# Patient Record
Sex: Female | Born: 1945 | Race: White | Hispanic: No | Marital: Married | Smoking: Current every day smoker
Health system: Southern US, Community
[De-identification: ages and names within clinical notes are randomized; demographics above are authoritative.]

## PROBLEM LIST (undated history)

## (undated) DIAGNOSIS — I6523 Occlusion and stenosis of bilateral carotid arteries: Secondary | ICD-10-CM

## (undated) DIAGNOSIS — Z7901 Long term (current) use of anticoagulants: Secondary | ICD-10-CM

## (undated) DIAGNOSIS — E079 Disorder of thyroid, unspecified: Secondary | ICD-10-CM

## (undated) DIAGNOSIS — I739 Peripheral vascular disease, unspecified: Secondary | ICD-10-CM

## (undated) DIAGNOSIS — F329 Major depressive disorder, single episode, unspecified: Secondary | ICD-10-CM

## (undated) DIAGNOSIS — I7090 Unspecified atherosclerosis: Secondary | ICD-10-CM

## (undated) DIAGNOSIS — K529 Noninfective gastroenteritis and colitis, unspecified: Secondary | ICD-10-CM

## (undated) DIAGNOSIS — M810 Age-related osteoporosis without current pathological fracture: Secondary | ICD-10-CM

## (undated) DIAGNOSIS — Z5181 Encounter for therapeutic drug level monitoring: Secondary | ICD-10-CM

## (undated) DIAGNOSIS — K432 Incisional hernia without obstruction or gangrene: Secondary | ICD-10-CM

## (undated) DIAGNOSIS — R791 Abnormal coagulation profile: Secondary | ICD-10-CM

## (undated) DIAGNOSIS — K922 Gastrointestinal hemorrhage, unspecified: Secondary | ICD-10-CM

## (undated) DIAGNOSIS — Z87442 Personal history of urinary calculi: Secondary | ICD-10-CM

## (undated) DIAGNOSIS — J309 Allergic rhinitis, unspecified: Secondary | ICD-10-CM

## (undated) DIAGNOSIS — J45909 Unspecified asthma, uncomplicated: Secondary | ICD-10-CM

## (undated) DIAGNOSIS — C4490 Unspecified malignant neoplasm of skin, unspecified: Secondary | ICD-10-CM

## (undated) DIAGNOSIS — Z9889 Other specified postprocedural states: Secondary | ICD-10-CM

## (undated) DIAGNOSIS — E559 Vitamin D deficiency, unspecified: Secondary | ICD-10-CM

## (undated) DIAGNOSIS — K589 Irritable bowel syndrome without diarrhea: Secondary | ICD-10-CM

## (undated) DIAGNOSIS — E785 Hyperlipidemia, unspecified: Secondary | ICD-10-CM

## (undated) DIAGNOSIS — R413 Other amnesia: Secondary | ICD-10-CM

## (undated) DIAGNOSIS — E039 Hypothyroidism, unspecified: Secondary | ICD-10-CM

## (undated) DIAGNOSIS — E663 Overweight: Secondary | ICD-10-CM

## (undated) HISTORY — PX: COLONOSCOPY: SHX174

## (undated) HISTORY — DX: Peripheral vascular disease, unspecified: I73.9

## (undated) HISTORY — DX: Encounter for therapeutic drug level monitoring: Z51.81

## (undated) HISTORY — PX: CAROTID STENT: SHX1301

## (undated) HISTORY — DX: Hyperlipidemia, unspecified: E78.5

## (undated) HISTORY — DX: Vitamin D deficiency, unspecified: E55.9

## (undated) HISTORY — DX: Unspecified asthma, uncomplicated: J45.909

## (undated) HISTORY — DX: Gastrointestinal hemorrhage, unspecified: K92.2

## (undated) HISTORY — DX: Occlusion and stenosis of bilateral carotid arteries: I65.23

## (undated) HISTORY — DX: Other amnesia: R41.3

## (undated) HISTORY — DX: Major depressive disorder, single episode, unspecified: F32.9

## (undated) HISTORY — DX: Disorder of thyroid, unspecified: E07.9

## (undated) HISTORY — PX: OTHER SURGICAL HISTORY: SHX169

## (undated) HISTORY — DX: Other specified postprocedural states: Z98.890

## (undated) HISTORY — DX: Allergic rhinitis, unspecified: J30.9

## (undated) HISTORY — DX: Unspecified atherosclerosis: I70.90

## (undated) HISTORY — DX: Overweight: E66.3

## (undated) HISTORY — DX: Incisional hernia without obstruction or gangrene: K43.2

## (undated) HISTORY — DX: Noninfective gastroenteritis and colitis, unspecified: K52.9

## (undated) HISTORY — DX: Long term (current) use of anticoagulants: Z79.01

## (undated) HISTORY — DX: Irritable bowel syndrome, unspecified: K58.9

## (undated) HISTORY — DX: Age-related osteoporosis without current pathological fracture: M81.0

## (undated) HISTORY — DX: Abnormal coagulation profile: R79.1

## (undated) HISTORY — DX: Hypothyroidism, unspecified: E03.9

---

## 1999-11-01 ENCOUNTER — Other Ambulatory Visit: Admission: RE | Admit: 1999-11-01 | Discharge: 1999-11-01 | Payer: Self-pay | Admitting: Family Medicine

## 2004-09-06 ENCOUNTER — Ambulatory Visit: Payer: Self-pay | Admitting: Family Medicine

## 2004-10-18 ENCOUNTER — Ambulatory Visit: Payer: Self-pay | Admitting: Family Medicine

## 2005-04-04 ENCOUNTER — Ambulatory Visit: Payer: Self-pay | Admitting: Family Medicine

## 2005-04-17 ENCOUNTER — Ambulatory Visit: Payer: Self-pay | Admitting: Family Medicine

## 2006-11-20 ENCOUNTER — Telehealth (INDEPENDENT_AMBULATORY_CARE_PROVIDER_SITE_OTHER): Payer: Self-pay | Admitting: *Deleted

## 2008-09-12 ENCOUNTER — Telehealth: Payer: Self-pay | Admitting: Family Medicine

## 2013-05-22 HISTORY — PX: CORONARY ANGIOPLASTY WITH STENT PLACEMENT: SHX49

## 2015-03-16 DIAGNOSIS — I2510 Atherosclerotic heart disease of native coronary artery without angina pectoris: Secondary | ICD-10-CM

## 2015-03-16 DIAGNOSIS — K922 Gastrointestinal hemorrhage, unspecified: Secondary | ICD-10-CM | POA: Insufficient documentation

## 2015-03-16 HISTORY — DX: Atherosclerotic heart disease of native coronary artery without angina pectoris: I25.10

## 2015-03-16 HISTORY — DX: Gastrointestinal hemorrhage, unspecified: K92.2

## 2015-05-06 DIAGNOSIS — Z7901 Long term (current) use of anticoagulants: Secondary | ICD-10-CM | POA: Insufficient documentation

## 2015-05-06 DIAGNOSIS — I739 Peripheral vascular disease, unspecified: Secondary | ICD-10-CM | POA: Insufficient documentation

## 2015-05-06 DIAGNOSIS — Z5181 Encounter for therapeutic drug level monitoring: Secondary | ICD-10-CM

## 2015-05-06 HISTORY — DX: Encounter for therapeutic drug level monitoring: Z51.81

## 2015-05-06 HISTORY — DX: Peripheral vascular disease, unspecified: I73.9

## 2015-05-06 HISTORY — DX: Long term (current) use of anticoagulants: Z79.01

## 2015-05-27 DIAGNOSIS — Z7901 Long term (current) use of anticoagulants: Secondary | ICD-10-CM | POA: Diagnosis not present

## 2015-05-27 DIAGNOSIS — I739 Peripheral vascular disease, unspecified: Secondary | ICD-10-CM | POA: Diagnosis not present

## 2015-05-27 DIAGNOSIS — Z5181 Encounter for therapeutic drug level monitoring: Secondary | ICD-10-CM | POA: Diagnosis not present

## 2015-06-15 DIAGNOSIS — Z7901 Long term (current) use of anticoagulants: Secondary | ICD-10-CM | POA: Diagnosis not present

## 2015-06-15 DIAGNOSIS — Z5181 Encounter for therapeutic drug level monitoring: Secondary | ICD-10-CM | POA: Diagnosis not present

## 2015-06-15 DIAGNOSIS — I739 Peripheral vascular disease, unspecified: Secondary | ICD-10-CM | POA: Diagnosis not present

## 2015-07-06 DIAGNOSIS — Z7901 Long term (current) use of anticoagulants: Secondary | ICD-10-CM | POA: Diagnosis not present

## 2015-07-06 DIAGNOSIS — Z5181 Encounter for therapeutic drug level monitoring: Secondary | ICD-10-CM | POA: Diagnosis not present

## 2015-07-06 DIAGNOSIS — I739 Peripheral vascular disease, unspecified: Secondary | ICD-10-CM | POA: Diagnosis not present

## 2015-07-08 DIAGNOSIS — E039 Hypothyroidism, unspecified: Secondary | ICD-10-CM | POA: Diagnosis not present

## 2015-07-08 DIAGNOSIS — I739 Peripheral vascular disease, unspecified: Secondary | ICD-10-CM | POA: Diagnosis not present

## 2015-07-08 DIAGNOSIS — I10 Essential (primary) hypertension: Secondary | ICD-10-CM | POA: Diagnosis not present

## 2015-07-08 DIAGNOSIS — E663 Overweight: Secondary | ICD-10-CM | POA: Diagnosis not present

## 2015-07-08 DIAGNOSIS — R413 Other amnesia: Secondary | ICD-10-CM | POA: Diagnosis not present

## 2015-07-08 DIAGNOSIS — Z79899 Other long term (current) drug therapy: Secondary | ICD-10-CM | POA: Diagnosis not present

## 2015-07-08 DIAGNOSIS — F329 Major depressive disorder, single episode, unspecified: Secondary | ICD-10-CM | POA: Diagnosis not present

## 2015-07-08 DIAGNOSIS — E559 Vitamin D deficiency, unspecified: Secondary | ICD-10-CM | POA: Diagnosis not present

## 2015-07-08 DIAGNOSIS — Z9889 Other specified postprocedural states: Secondary | ICD-10-CM | POA: Diagnosis not present

## 2015-07-08 DIAGNOSIS — E785 Hyperlipidemia, unspecified: Secondary | ICD-10-CM | POA: Diagnosis not present

## 2015-07-08 DIAGNOSIS — Z6827 Body mass index (BMI) 27.0-27.9, adult: Secondary | ICD-10-CM | POA: Diagnosis not present

## 2015-07-08 DIAGNOSIS — I2510 Atherosclerotic heart disease of native coronary artery without angina pectoris: Secondary | ICD-10-CM | POA: Diagnosis not present

## 2015-07-12 DIAGNOSIS — Z7901 Long term (current) use of anticoagulants: Secondary | ICD-10-CM | POA: Diagnosis not present

## 2015-07-12 DIAGNOSIS — I739 Peripheral vascular disease, unspecified: Secondary | ICD-10-CM | POA: Diagnosis not present

## 2015-07-12 DIAGNOSIS — S01511A Laceration without foreign body of lip, initial encounter: Secondary | ICD-10-CM | POA: Diagnosis not present

## 2015-08-03 DIAGNOSIS — Z7901 Long term (current) use of anticoagulants: Secondary | ICD-10-CM | POA: Diagnosis not present

## 2015-08-03 DIAGNOSIS — I739 Peripheral vascular disease, unspecified: Secondary | ICD-10-CM | POA: Diagnosis not present

## 2015-08-03 DIAGNOSIS — Z5181 Encounter for therapeutic drug level monitoring: Secondary | ICD-10-CM | POA: Diagnosis not present

## 2015-09-07 DIAGNOSIS — Z5181 Encounter for therapeutic drug level monitoring: Secondary | ICD-10-CM | POA: Diagnosis not present

## 2015-09-07 DIAGNOSIS — I739 Peripheral vascular disease, unspecified: Secondary | ICD-10-CM | POA: Diagnosis not present

## 2015-09-07 DIAGNOSIS — Z7901 Long term (current) use of anticoagulants: Secondary | ICD-10-CM | POA: Diagnosis not present

## 2015-09-27 DIAGNOSIS — Z95820 Peripheral vascular angioplasty status with implants and grafts: Secondary | ICD-10-CM | POA: Diagnosis not present

## 2015-09-27 DIAGNOSIS — I70213 Atherosclerosis of native arteries of extremities with intermittent claudication, bilateral legs: Secondary | ICD-10-CM | POA: Diagnosis not present

## 2015-09-27 DIAGNOSIS — I6523 Occlusion and stenosis of bilateral carotid arteries: Secondary | ICD-10-CM | POA: Diagnosis not present

## 2015-09-30 DIAGNOSIS — I6523 Occlusion and stenosis of bilateral carotid arteries: Secondary | ICD-10-CM | POA: Diagnosis not present

## 2015-09-30 DIAGNOSIS — Z7901 Long term (current) use of anticoagulants: Secondary | ICD-10-CM | POA: Diagnosis not present

## 2015-09-30 DIAGNOSIS — I739 Peripheral vascular disease, unspecified: Secondary | ICD-10-CM | POA: Diagnosis not present

## 2015-09-30 HISTORY — DX: Occlusion and stenosis of bilateral carotid arteries: I65.23

## 2015-10-05 DIAGNOSIS — Z7901 Long term (current) use of anticoagulants: Secondary | ICD-10-CM | POA: Diagnosis not present

## 2015-10-05 DIAGNOSIS — I739 Peripheral vascular disease, unspecified: Secondary | ICD-10-CM | POA: Diagnosis not present

## 2015-10-05 DIAGNOSIS — Z5181 Encounter for therapeutic drug level monitoring: Secondary | ICD-10-CM | POA: Diagnosis not present

## 2015-10-07 DIAGNOSIS — E785 Hyperlipidemia, unspecified: Secondary | ICD-10-CM | POA: Diagnosis not present

## 2015-10-07 DIAGNOSIS — E039 Hypothyroidism, unspecified: Secondary | ICD-10-CM | POA: Diagnosis not present

## 2015-10-13 DIAGNOSIS — Z1389 Encounter for screening for other disorder: Secondary | ICD-10-CM | POA: Diagnosis not present

## 2015-10-13 DIAGNOSIS — Z139 Encounter for screening, unspecified: Secondary | ICD-10-CM | POA: Diagnosis not present

## 2015-10-13 DIAGNOSIS — Z0000 Encounter for general adult medical examination without abnormal findings: Secondary | ICD-10-CM | POA: Diagnosis not present

## 2015-10-13 DIAGNOSIS — I10 Essential (primary) hypertension: Secondary | ICD-10-CM | POA: Diagnosis not present

## 2015-10-13 DIAGNOSIS — E663 Overweight: Secondary | ICD-10-CM | POA: Diagnosis not present

## 2015-10-13 DIAGNOSIS — E559 Vitamin D deficiency, unspecified: Secondary | ICD-10-CM | POA: Diagnosis not present

## 2015-10-13 DIAGNOSIS — E785 Hyperlipidemia, unspecified: Secondary | ICD-10-CM | POA: Diagnosis not present

## 2015-10-13 DIAGNOSIS — Z9181 History of falling: Secondary | ICD-10-CM | POA: Diagnosis not present

## 2015-10-13 DIAGNOSIS — E039 Hypothyroidism, unspecified: Secondary | ICD-10-CM | POA: Diagnosis not present

## 2015-10-13 DIAGNOSIS — Z79899 Other long term (current) drug therapy: Secondary | ICD-10-CM | POA: Diagnosis not present

## 2015-10-13 DIAGNOSIS — Z1212 Encounter for screening for malignant neoplasm of rectum: Secondary | ICD-10-CM | POA: Diagnosis not present

## 2015-10-13 DIAGNOSIS — Z6827 Body mass index (BMI) 27.0-27.9, adult: Secondary | ICD-10-CM | POA: Diagnosis not present

## 2015-10-18 DIAGNOSIS — Z780 Asymptomatic menopausal state: Secondary | ICD-10-CM | POA: Diagnosis not present

## 2015-10-18 DIAGNOSIS — M810 Age-related osteoporosis without current pathological fracture: Secondary | ICD-10-CM | POA: Diagnosis not present

## 2015-10-18 DIAGNOSIS — M818 Other osteoporosis without current pathological fracture: Secondary | ICD-10-CM | POA: Diagnosis not present

## 2015-10-18 DIAGNOSIS — Z1231 Encounter for screening mammogram for malignant neoplasm of breast: Secondary | ICD-10-CM | POA: Diagnosis not present

## 2015-11-02 DIAGNOSIS — Z5181 Encounter for therapeutic drug level monitoring: Secondary | ICD-10-CM | POA: Diagnosis not present

## 2015-11-02 DIAGNOSIS — Z7901 Long term (current) use of anticoagulants: Secondary | ICD-10-CM | POA: Diagnosis not present

## 2015-11-02 DIAGNOSIS — I739 Peripheral vascular disease, unspecified: Secondary | ICD-10-CM | POA: Diagnosis not present

## 2015-11-30 DIAGNOSIS — R791 Abnormal coagulation profile: Secondary | ICD-10-CM | POA: Diagnosis not present

## 2015-11-30 DIAGNOSIS — Z7901 Long term (current) use of anticoagulants: Secondary | ICD-10-CM | POA: Diagnosis not present

## 2015-11-30 DIAGNOSIS — Z5181 Encounter for therapeutic drug level monitoring: Secondary | ICD-10-CM | POA: Diagnosis not present

## 2015-11-30 DIAGNOSIS — I739 Peripheral vascular disease, unspecified: Secondary | ICD-10-CM | POA: Diagnosis not present

## 2015-11-30 HISTORY — DX: Abnormal coagulation profile: R79.1

## 2015-12-22 DIAGNOSIS — Z7901 Long term (current) use of anticoagulants: Secondary | ICD-10-CM | POA: Diagnosis not present

## 2015-12-22 DIAGNOSIS — R791 Abnormal coagulation profile: Secondary | ICD-10-CM | POA: Diagnosis not present

## 2015-12-22 DIAGNOSIS — I739 Peripheral vascular disease, unspecified: Secondary | ICD-10-CM | POA: Diagnosis not present

## 2015-12-22 DIAGNOSIS — Z5181 Encounter for therapeutic drug level monitoring: Secondary | ICD-10-CM | POA: Diagnosis not present

## 2016-01-03 DIAGNOSIS — I700 Atherosclerosis of aorta: Secondary | ICD-10-CM | POA: Diagnosis not present

## 2016-01-03 DIAGNOSIS — Z95820 Peripheral vascular angioplasty status with implants and grafts: Secondary | ICD-10-CM | POA: Diagnosis not present

## 2016-01-03 DIAGNOSIS — I70213 Atherosclerosis of native arteries of extremities with intermittent claudication, bilateral legs: Secondary | ICD-10-CM | POA: Diagnosis not present

## 2016-01-03 DIAGNOSIS — I739 Peripheral vascular disease, unspecified: Secondary | ICD-10-CM | POA: Diagnosis not present

## 2016-01-03 DIAGNOSIS — Z7982 Long term (current) use of aspirin: Secondary | ICD-10-CM | POA: Diagnosis not present

## 2016-01-03 DIAGNOSIS — Z95828 Presence of other vascular implants and grafts: Secondary | ICD-10-CM | POA: Diagnosis not present

## 2016-01-03 DIAGNOSIS — Z7901 Long term (current) use of anticoagulants: Secondary | ICD-10-CM | POA: Diagnosis not present

## 2016-01-11 DIAGNOSIS — R791 Abnormal coagulation profile: Secondary | ICD-10-CM | POA: Diagnosis not present

## 2016-01-11 DIAGNOSIS — I739 Peripheral vascular disease, unspecified: Secondary | ICD-10-CM | POA: Diagnosis not present

## 2016-01-11 DIAGNOSIS — Z7901 Long term (current) use of anticoagulants: Secondary | ICD-10-CM | POA: Diagnosis not present

## 2016-01-11 DIAGNOSIS — Z5181 Encounter for therapeutic drug level monitoring: Secondary | ICD-10-CM | POA: Diagnosis not present

## 2016-01-13 DIAGNOSIS — E663 Overweight: Secondary | ICD-10-CM | POA: Diagnosis not present

## 2016-01-13 DIAGNOSIS — Z6827 Body mass index (BMI) 27.0-27.9, adult: Secondary | ICD-10-CM | POA: Diagnosis not present

## 2016-01-13 DIAGNOSIS — L02439 Carbuncle of limb, unspecified: Secondary | ICD-10-CM | POA: Diagnosis not present

## 2016-02-01 DIAGNOSIS — Z7901 Long term (current) use of anticoagulants: Secondary | ICD-10-CM | POA: Diagnosis not present

## 2016-02-01 DIAGNOSIS — I739 Peripheral vascular disease, unspecified: Secondary | ICD-10-CM | POA: Diagnosis not present

## 2016-02-01 DIAGNOSIS — R791 Abnormal coagulation profile: Secondary | ICD-10-CM | POA: Diagnosis not present

## 2016-02-01 DIAGNOSIS — Z5181 Encounter for therapeutic drug level monitoring: Secondary | ICD-10-CM | POA: Diagnosis not present

## 2016-02-14 DIAGNOSIS — E785 Hyperlipidemia, unspecified: Secondary | ICD-10-CM | POA: Diagnosis not present

## 2016-02-14 DIAGNOSIS — E039 Hypothyroidism, unspecified: Secondary | ICD-10-CM | POA: Diagnosis not present

## 2016-02-15 DIAGNOSIS — I739 Peripheral vascular disease, unspecified: Secondary | ICD-10-CM | POA: Diagnosis not present

## 2016-02-15 DIAGNOSIS — Z7901 Long term (current) use of anticoagulants: Secondary | ICD-10-CM | POA: Diagnosis not present

## 2016-02-15 DIAGNOSIS — Z5181 Encounter for therapeutic drug level monitoring: Secondary | ICD-10-CM | POA: Diagnosis not present

## 2016-02-17 DIAGNOSIS — I10 Essential (primary) hypertension: Secondary | ICD-10-CM | POA: Diagnosis not present

## 2016-02-17 DIAGNOSIS — Z23 Encounter for immunization: Secondary | ICD-10-CM | POA: Diagnosis not present

## 2016-02-17 DIAGNOSIS — Z6827 Body mass index (BMI) 27.0-27.9, adult: Secondary | ICD-10-CM | POA: Diagnosis not present

## 2016-02-17 DIAGNOSIS — K432 Incisional hernia without obstruction or gangrene: Secondary | ICD-10-CM | POA: Diagnosis not present

## 2016-02-17 DIAGNOSIS — E039 Hypothyroidism, unspecified: Secondary | ICD-10-CM | POA: Diagnosis not present

## 2016-02-17 DIAGNOSIS — E785 Hyperlipidemia, unspecified: Secondary | ICD-10-CM | POA: Diagnosis not present

## 2016-02-17 DIAGNOSIS — L02439 Carbuncle of limb, unspecified: Secondary | ICD-10-CM | POA: Diagnosis not present

## 2016-02-17 DIAGNOSIS — Z79899 Other long term (current) drug therapy: Secondary | ICD-10-CM | POA: Diagnosis not present

## 2016-02-17 DIAGNOSIS — E663 Overweight: Secondary | ICD-10-CM | POA: Diagnosis not present

## 2016-02-17 DIAGNOSIS — I739 Peripheral vascular disease, unspecified: Secondary | ICD-10-CM | POA: Diagnosis not present

## 2016-02-21 DIAGNOSIS — Z7901 Long term (current) use of anticoagulants: Secondary | ICD-10-CM | POA: Diagnosis not present

## 2016-02-21 DIAGNOSIS — I739 Peripheral vascular disease, unspecified: Secondary | ICD-10-CM | POA: Diagnosis not present

## 2016-02-21 DIAGNOSIS — Z5181 Encounter for therapeutic drug level monitoring: Secondary | ICD-10-CM | POA: Diagnosis not present

## 2016-02-22 DIAGNOSIS — I739 Peripheral vascular disease, unspecified: Secondary | ICD-10-CM | POA: Diagnosis not present

## 2016-02-22 DIAGNOSIS — Z6827 Body mass index (BMI) 27.0-27.9, adult: Secondary | ICD-10-CM | POA: Diagnosis not present

## 2016-02-22 DIAGNOSIS — L02439 Carbuncle of limb, unspecified: Secondary | ICD-10-CM | POA: Diagnosis not present

## 2016-03-02 DIAGNOSIS — E785 Hyperlipidemia, unspecified: Secondary | ICD-10-CM | POA: Diagnosis not present

## 2016-03-02 DIAGNOSIS — L02439 Carbuncle of limb, unspecified: Secondary | ICD-10-CM | POA: Diagnosis not present

## 2016-03-02 DIAGNOSIS — I10 Essential (primary) hypertension: Secondary | ICD-10-CM | POA: Diagnosis not present

## 2016-03-02 DIAGNOSIS — Z6826 Body mass index (BMI) 26.0-26.9, adult: Secondary | ICD-10-CM | POA: Diagnosis not present

## 2016-03-02 DIAGNOSIS — E039 Hypothyroidism, unspecified: Secondary | ICD-10-CM | POA: Diagnosis not present

## 2016-03-09 DIAGNOSIS — I739 Peripheral vascular disease, unspecified: Secondary | ICD-10-CM | POA: Diagnosis not present

## 2016-03-09 DIAGNOSIS — R791 Abnormal coagulation profile: Secondary | ICD-10-CM | POA: Diagnosis not present

## 2016-03-09 DIAGNOSIS — Z7901 Long term (current) use of anticoagulants: Secondary | ICD-10-CM | POA: Diagnosis not present

## 2016-03-09 DIAGNOSIS — Z5181 Encounter for therapeutic drug level monitoring: Secondary | ICD-10-CM | POA: Diagnosis not present

## 2016-03-16 DIAGNOSIS — Z5181 Encounter for therapeutic drug level monitoring: Secondary | ICD-10-CM | POA: Diagnosis not present

## 2016-03-16 DIAGNOSIS — R791 Abnormal coagulation profile: Secondary | ICD-10-CM | POA: Diagnosis not present

## 2016-03-16 DIAGNOSIS — I739 Peripheral vascular disease, unspecified: Secondary | ICD-10-CM | POA: Diagnosis not present

## 2016-03-16 DIAGNOSIS — Z7901 Long term (current) use of anticoagulants: Secondary | ICD-10-CM | POA: Diagnosis not present

## 2016-03-23 DIAGNOSIS — Z5181 Encounter for therapeutic drug level monitoring: Secondary | ICD-10-CM | POA: Diagnosis not present

## 2016-03-23 DIAGNOSIS — Z7901 Long term (current) use of anticoagulants: Secondary | ICD-10-CM | POA: Diagnosis not present

## 2016-03-23 DIAGNOSIS — I739 Peripheral vascular disease, unspecified: Secondary | ICD-10-CM | POA: Diagnosis not present

## 2016-03-28 DIAGNOSIS — L304 Erythema intertrigo: Secondary | ICD-10-CM | POA: Diagnosis not present

## 2016-03-28 DIAGNOSIS — L02214 Cutaneous abscess of groin: Secondary | ICD-10-CM | POA: Diagnosis not present

## 2016-04-04 DIAGNOSIS — I739 Peripheral vascular disease, unspecified: Secondary | ICD-10-CM | POA: Diagnosis not present

## 2016-04-04 DIAGNOSIS — Z7901 Long term (current) use of anticoagulants: Secondary | ICD-10-CM | POA: Diagnosis not present

## 2016-04-04 DIAGNOSIS — Z95820 Peripheral vascular angioplasty status with implants and grafts: Secondary | ICD-10-CM | POA: Diagnosis not present

## 2016-04-04 DIAGNOSIS — I6523 Occlusion and stenosis of bilateral carotid arteries: Secondary | ICD-10-CM | POA: Diagnosis not present

## 2016-04-10 DIAGNOSIS — I739 Peripheral vascular disease, unspecified: Secondary | ICD-10-CM | POA: Diagnosis not present

## 2016-04-10 DIAGNOSIS — I6523 Occlusion and stenosis of bilateral carotid arteries: Secondary | ICD-10-CM | POA: Diagnosis not present

## 2016-04-10 DIAGNOSIS — Z95820 Peripheral vascular angioplasty status with implants and grafts: Secondary | ICD-10-CM | POA: Diagnosis not present

## 2016-04-10 DIAGNOSIS — Z7901 Long term (current) use of anticoagulants: Secondary | ICD-10-CM | POA: Diagnosis not present

## 2016-04-18 DIAGNOSIS — Z5181 Encounter for therapeutic drug level monitoring: Secondary | ICD-10-CM | POA: Diagnosis not present

## 2016-04-18 DIAGNOSIS — Z7901 Long term (current) use of anticoagulants: Secondary | ICD-10-CM | POA: Diagnosis not present

## 2016-04-18 DIAGNOSIS — R791 Abnormal coagulation profile: Secondary | ICD-10-CM | POA: Diagnosis not present

## 2016-04-18 DIAGNOSIS — I739 Peripheral vascular disease, unspecified: Secondary | ICD-10-CM | POA: Diagnosis not present

## 2016-04-20 DIAGNOSIS — L02214 Cutaneous abscess of groin: Secondary | ICD-10-CM | POA: Diagnosis not present

## 2016-04-25 DIAGNOSIS — Z5181 Encounter for therapeutic drug level monitoring: Secondary | ICD-10-CM | POA: Diagnosis not present

## 2016-04-25 DIAGNOSIS — Z7901 Long term (current) use of anticoagulants: Secondary | ICD-10-CM | POA: Diagnosis not present

## 2016-04-25 DIAGNOSIS — I739 Peripheral vascular disease, unspecified: Secondary | ICD-10-CM | POA: Diagnosis not present

## 2016-04-25 DIAGNOSIS — R791 Abnormal coagulation profile: Secondary | ICD-10-CM | POA: Diagnosis not present

## 2016-05-02 DIAGNOSIS — Z5181 Encounter for therapeutic drug level monitoring: Secondary | ICD-10-CM | POA: Diagnosis not present

## 2016-05-02 DIAGNOSIS — Z7901 Long term (current) use of anticoagulants: Secondary | ICD-10-CM | POA: Diagnosis not present

## 2016-05-02 DIAGNOSIS — R791 Abnormal coagulation profile: Secondary | ICD-10-CM | POA: Diagnosis not present

## 2016-05-02 DIAGNOSIS — I739 Peripheral vascular disease, unspecified: Secondary | ICD-10-CM | POA: Diagnosis not present

## 2016-05-09 DIAGNOSIS — Z5181 Encounter for therapeutic drug level monitoring: Secondary | ICD-10-CM | POA: Diagnosis not present

## 2016-05-09 DIAGNOSIS — Z7901 Long term (current) use of anticoagulants: Secondary | ICD-10-CM | POA: Diagnosis not present

## 2016-05-09 DIAGNOSIS — R791 Abnormal coagulation profile: Secondary | ICD-10-CM | POA: Diagnosis not present

## 2016-05-09 DIAGNOSIS — I739 Peripheral vascular disease, unspecified: Secondary | ICD-10-CM | POA: Diagnosis not present

## 2016-05-24 DIAGNOSIS — I739 Peripheral vascular disease, unspecified: Secondary | ICD-10-CM | POA: Diagnosis not present

## 2016-05-24 DIAGNOSIS — Z7901 Long term (current) use of anticoagulants: Secondary | ICD-10-CM | POA: Diagnosis not present

## 2016-05-24 DIAGNOSIS — Z5181 Encounter for therapeutic drug level monitoring: Secondary | ICD-10-CM | POA: Diagnosis not present

## 2016-05-25 DIAGNOSIS — L02214 Cutaneous abscess of groin: Secondary | ICD-10-CM | POA: Diagnosis not present

## 2016-06-01 DIAGNOSIS — L02439 Carbuncle of limb, unspecified: Secondary | ICD-10-CM | POA: Diagnosis not present

## 2016-06-01 DIAGNOSIS — I10 Essential (primary) hypertension: Secondary | ICD-10-CM | POA: Diagnosis not present

## 2016-06-01 DIAGNOSIS — F329 Major depressive disorder, single episode, unspecified: Secondary | ICD-10-CM | POA: Diagnosis not present

## 2016-06-01 DIAGNOSIS — Z6826 Body mass index (BMI) 26.0-26.9, adult: Secondary | ICD-10-CM | POA: Diagnosis not present

## 2016-06-01 DIAGNOSIS — E559 Vitamin D deficiency, unspecified: Secondary | ICD-10-CM | POA: Diagnosis not present

## 2016-06-01 DIAGNOSIS — I2510 Atherosclerotic heart disease of native coronary artery without angina pectoris: Secondary | ICD-10-CM | POA: Diagnosis not present

## 2016-06-01 DIAGNOSIS — R413 Other amnesia: Secondary | ICD-10-CM | POA: Diagnosis not present

## 2016-06-01 DIAGNOSIS — E785 Hyperlipidemia, unspecified: Secondary | ICD-10-CM | POA: Diagnosis not present

## 2016-06-01 DIAGNOSIS — E663 Overweight: Secondary | ICD-10-CM | POA: Diagnosis not present

## 2016-06-06 DIAGNOSIS — Z5181 Encounter for therapeutic drug level monitoring: Secondary | ICD-10-CM | POA: Diagnosis not present

## 2016-06-06 DIAGNOSIS — Z7901 Long term (current) use of anticoagulants: Secondary | ICD-10-CM | POA: Diagnosis not present

## 2016-06-06 DIAGNOSIS — I739 Peripheral vascular disease, unspecified: Secondary | ICD-10-CM | POA: Diagnosis not present

## 2016-06-06 DIAGNOSIS — R791 Abnormal coagulation profile: Secondary | ICD-10-CM | POA: Diagnosis not present

## 2016-06-09 DIAGNOSIS — Z5181 Encounter for therapeutic drug level monitoring: Secondary | ICD-10-CM | POA: Diagnosis not present

## 2016-06-09 DIAGNOSIS — I739 Peripheral vascular disease, unspecified: Secondary | ICD-10-CM | POA: Diagnosis not present

## 2016-06-09 DIAGNOSIS — Z7901 Long term (current) use of anticoagulants: Secondary | ICD-10-CM | POA: Diagnosis not present

## 2016-06-09 DIAGNOSIS — R791 Abnormal coagulation profile: Secondary | ICD-10-CM | POA: Diagnosis not present

## 2016-06-15 DIAGNOSIS — L02214 Cutaneous abscess of groin: Secondary | ICD-10-CM | POA: Diagnosis not present

## 2016-06-16 DIAGNOSIS — Z7901 Long term (current) use of anticoagulants: Secondary | ICD-10-CM | POA: Diagnosis not present

## 2016-06-16 DIAGNOSIS — Z5181 Encounter for therapeutic drug level monitoring: Secondary | ICD-10-CM | POA: Diagnosis not present

## 2016-06-16 DIAGNOSIS — I739 Peripheral vascular disease, unspecified: Secondary | ICD-10-CM | POA: Diagnosis not present

## 2016-07-05 DIAGNOSIS — Z5181 Encounter for therapeutic drug level monitoring: Secondary | ICD-10-CM | POA: Diagnosis not present

## 2016-07-05 DIAGNOSIS — R791 Abnormal coagulation profile: Secondary | ICD-10-CM | POA: Diagnosis not present

## 2016-07-05 DIAGNOSIS — Z7901 Long term (current) use of anticoagulants: Secondary | ICD-10-CM | POA: Diagnosis not present

## 2016-07-05 DIAGNOSIS — I739 Peripheral vascular disease, unspecified: Secondary | ICD-10-CM | POA: Diagnosis not present

## 2016-07-19 DIAGNOSIS — K432 Incisional hernia without obstruction or gangrene: Secondary | ICD-10-CM | POA: Diagnosis not present

## 2016-07-19 DIAGNOSIS — I700 Atherosclerosis of aorta: Secondary | ICD-10-CM | POA: Diagnosis not present

## 2016-07-19 DIAGNOSIS — I70209 Unspecified atherosclerosis of native arteries of extremities, unspecified extremity: Secondary | ICD-10-CM | POA: Diagnosis not present

## 2016-07-19 DIAGNOSIS — Z7901 Long term (current) use of anticoagulants: Secondary | ICD-10-CM | POA: Diagnosis not present

## 2016-07-19 DIAGNOSIS — I739 Peripheral vascular disease, unspecified: Secondary | ICD-10-CM | POA: Diagnosis not present

## 2016-07-19 HISTORY — DX: Incisional hernia without obstruction or gangrene: K43.2

## 2016-08-01 DIAGNOSIS — I739 Peripheral vascular disease, unspecified: Secondary | ICD-10-CM | POA: Diagnosis not present

## 2016-08-01 DIAGNOSIS — Z5181 Encounter for therapeutic drug level monitoring: Secondary | ICD-10-CM | POA: Diagnosis not present

## 2016-08-01 DIAGNOSIS — Z7901 Long term (current) use of anticoagulants: Secondary | ICD-10-CM | POA: Diagnosis not present

## 2016-09-05 DIAGNOSIS — Z7901 Long term (current) use of anticoagulants: Secondary | ICD-10-CM | POA: Diagnosis not present

## 2016-09-05 DIAGNOSIS — I739 Peripheral vascular disease, unspecified: Secondary | ICD-10-CM | POA: Diagnosis not present

## 2016-09-05 DIAGNOSIS — Z5181 Encounter for therapeutic drug level monitoring: Secondary | ICD-10-CM | POA: Diagnosis not present

## 2016-09-26 DIAGNOSIS — E785 Hyperlipidemia, unspecified: Secondary | ICD-10-CM | POA: Diagnosis not present

## 2016-09-26 DIAGNOSIS — I739 Peripheral vascular disease, unspecified: Secondary | ICD-10-CM | POA: Diagnosis not present

## 2016-09-26 DIAGNOSIS — R413 Other amnesia: Secondary | ICD-10-CM | POA: Diagnosis not present

## 2016-09-26 DIAGNOSIS — Z6825 Body mass index (BMI) 25.0-25.9, adult: Secondary | ICD-10-CM | POA: Diagnosis not present

## 2016-09-26 DIAGNOSIS — E663 Overweight: Secondary | ICD-10-CM | POA: Diagnosis not present

## 2016-09-26 DIAGNOSIS — L02439 Carbuncle of limb, unspecified: Secondary | ICD-10-CM | POA: Diagnosis not present

## 2016-09-26 DIAGNOSIS — Z79899 Other long term (current) drug therapy: Secondary | ICD-10-CM | POA: Diagnosis not present

## 2016-09-26 DIAGNOSIS — I10 Essential (primary) hypertension: Secondary | ICD-10-CM | POA: Diagnosis not present

## 2016-09-26 DIAGNOSIS — E559 Vitamin D deficiency, unspecified: Secondary | ICD-10-CM | POA: Diagnosis not present

## 2016-09-26 DIAGNOSIS — I2510 Atherosclerotic heart disease of native coronary artery without angina pectoris: Secondary | ICD-10-CM | POA: Diagnosis not present

## 2016-10-03 DIAGNOSIS — I739 Peripheral vascular disease, unspecified: Secondary | ICD-10-CM | POA: Diagnosis not present

## 2016-10-03 DIAGNOSIS — Z5181 Encounter for therapeutic drug level monitoring: Secondary | ICD-10-CM | POA: Diagnosis not present

## 2016-10-03 DIAGNOSIS — R791 Abnormal coagulation profile: Secondary | ICD-10-CM | POA: Diagnosis not present

## 2016-10-03 DIAGNOSIS — Z7901 Long term (current) use of anticoagulants: Secondary | ICD-10-CM | POA: Diagnosis not present

## 2016-10-12 DIAGNOSIS — I739 Peripheral vascular disease, unspecified: Secondary | ICD-10-CM | POA: Diagnosis not present

## 2016-10-12 DIAGNOSIS — Z5181 Encounter for therapeutic drug level monitoring: Secondary | ICD-10-CM | POA: Diagnosis not present

## 2016-10-12 DIAGNOSIS — Z7901 Long term (current) use of anticoagulants: Secondary | ICD-10-CM | POA: Diagnosis not present

## 2016-11-02 DIAGNOSIS — R791 Abnormal coagulation profile: Secondary | ICD-10-CM | POA: Diagnosis not present

## 2016-11-02 DIAGNOSIS — Z7901 Long term (current) use of anticoagulants: Secondary | ICD-10-CM | POA: Diagnosis not present

## 2016-11-02 DIAGNOSIS — Z5181 Encounter for therapeutic drug level monitoring: Secondary | ICD-10-CM | POA: Diagnosis not present

## 2016-11-02 DIAGNOSIS — I739 Peripheral vascular disease, unspecified: Secondary | ICD-10-CM | POA: Diagnosis not present

## 2016-11-16 DIAGNOSIS — Z5181 Encounter for therapeutic drug level monitoring: Secondary | ICD-10-CM | POA: Diagnosis not present

## 2016-11-16 DIAGNOSIS — Z7901 Long term (current) use of anticoagulants: Secondary | ICD-10-CM | POA: Diagnosis not present

## 2016-11-16 DIAGNOSIS — I739 Peripheral vascular disease, unspecified: Secondary | ICD-10-CM | POA: Diagnosis not present

## 2016-11-29 DIAGNOSIS — E785 Hyperlipidemia, unspecified: Secondary | ICD-10-CM | POA: Diagnosis not present

## 2016-11-29 DIAGNOSIS — Z1211 Encounter for screening for malignant neoplasm of colon: Secondary | ICD-10-CM | POA: Diagnosis not present

## 2016-11-29 DIAGNOSIS — Z0000 Encounter for general adult medical examination without abnormal findings: Secondary | ICD-10-CM | POA: Diagnosis not present

## 2016-11-29 DIAGNOSIS — Z1389 Encounter for screening for other disorder: Secondary | ICD-10-CM | POA: Diagnosis not present

## 2016-11-29 DIAGNOSIS — Z9181 History of falling: Secondary | ICD-10-CM | POA: Diagnosis not present

## 2016-11-29 DIAGNOSIS — Z1231 Encounter for screening mammogram for malignant neoplasm of breast: Secondary | ICD-10-CM | POA: Diagnosis not present

## 2016-11-30 DIAGNOSIS — C44622 Squamous cell carcinoma of skin of right upper limb, including shoulder: Secondary | ICD-10-CM | POA: Diagnosis not present

## 2016-12-04 DIAGNOSIS — Z1231 Encounter for screening mammogram for malignant neoplasm of breast: Secondary | ICD-10-CM | POA: Diagnosis not present

## 2016-12-14 DIAGNOSIS — I739 Peripheral vascular disease, unspecified: Secondary | ICD-10-CM | POA: Diagnosis not present

## 2016-12-14 DIAGNOSIS — Z5181 Encounter for therapeutic drug level monitoring: Secondary | ICD-10-CM | POA: Diagnosis not present

## 2016-12-14 DIAGNOSIS — Z7901 Long term (current) use of anticoagulants: Secondary | ICD-10-CM | POA: Diagnosis not present

## 2016-12-21 DIAGNOSIS — C44622 Squamous cell carcinoma of skin of right upper limb, including shoulder: Secondary | ICD-10-CM | POA: Diagnosis not present

## 2016-12-29 DIAGNOSIS — I2510 Atherosclerotic heart disease of native coronary artery without angina pectoris: Secondary | ICD-10-CM | POA: Diagnosis not present

## 2016-12-29 DIAGNOSIS — R079 Chest pain, unspecified: Secondary | ICD-10-CM | POA: Diagnosis not present

## 2017-01-09 DIAGNOSIS — K591 Functional diarrhea: Secondary | ICD-10-CM | POA: Diagnosis not present

## 2017-01-09 DIAGNOSIS — Z1211 Encounter for screening for malignant neoplasm of colon: Secondary | ICD-10-CM | POA: Diagnosis not present

## 2017-01-11 DIAGNOSIS — I739 Peripheral vascular disease, unspecified: Secondary | ICD-10-CM | POA: Diagnosis not present

## 2017-01-11 DIAGNOSIS — A09 Infectious gastroenteritis and colitis, unspecified: Secondary | ICD-10-CM | POA: Diagnosis not present

## 2017-01-11 DIAGNOSIS — Z5181 Encounter for therapeutic drug level monitoring: Secondary | ICD-10-CM | POA: Diagnosis not present

## 2017-01-11 DIAGNOSIS — Z7901 Long term (current) use of anticoagulants: Secondary | ICD-10-CM | POA: Diagnosis not present

## 2017-02-06 DIAGNOSIS — E785 Hyperlipidemia, unspecified: Secondary | ICD-10-CM | POA: Diagnosis not present

## 2017-02-06 DIAGNOSIS — R413 Other amnesia: Secondary | ICD-10-CM | POA: Diagnosis not present

## 2017-02-06 DIAGNOSIS — I739 Peripheral vascular disease, unspecified: Secondary | ICD-10-CM | POA: Diagnosis not present

## 2017-02-06 DIAGNOSIS — I10 Essential (primary) hypertension: Secondary | ICD-10-CM | POA: Diagnosis not present

## 2017-02-06 DIAGNOSIS — Z1389 Encounter for screening for other disorder: Secondary | ICD-10-CM | POA: Diagnosis not present

## 2017-02-06 DIAGNOSIS — Z6827 Body mass index (BMI) 27.0-27.9, adult: Secondary | ICD-10-CM | POA: Diagnosis not present

## 2017-02-06 DIAGNOSIS — Z79899 Other long term (current) drug therapy: Secondary | ICD-10-CM | POA: Diagnosis not present

## 2017-02-06 DIAGNOSIS — E559 Vitamin D deficiency, unspecified: Secondary | ICD-10-CM | POA: Diagnosis not present

## 2017-02-06 DIAGNOSIS — I2510 Atherosclerotic heart disease of native coronary artery without angina pectoris: Secondary | ICD-10-CM | POA: Diagnosis not present

## 2017-02-06 DIAGNOSIS — Z23 Encounter for immunization: Secondary | ICD-10-CM | POA: Diagnosis not present

## 2017-02-15 DIAGNOSIS — Z7901 Long term (current) use of anticoagulants: Secondary | ICD-10-CM | POA: Diagnosis not present

## 2017-02-15 DIAGNOSIS — Z5181 Encounter for therapeutic drug level monitoring: Secondary | ICD-10-CM | POA: Diagnosis not present

## 2017-02-15 DIAGNOSIS — I739 Peripheral vascular disease, unspecified: Secondary | ICD-10-CM | POA: Diagnosis not present

## 2017-03-16 DIAGNOSIS — Z23 Encounter for immunization: Secondary | ICD-10-CM | POA: Diagnosis not present

## 2017-03-29 DIAGNOSIS — I739 Peripheral vascular disease, unspecified: Secondary | ICD-10-CM | POA: Diagnosis not present

## 2017-03-29 DIAGNOSIS — Z7901 Long term (current) use of anticoagulants: Secondary | ICD-10-CM | POA: Diagnosis not present

## 2017-03-29 DIAGNOSIS — Z5181 Encounter for therapeutic drug level monitoring: Secondary | ICD-10-CM | POA: Diagnosis not present

## 2017-04-10 DIAGNOSIS — C44622 Squamous cell carcinoma of skin of right upper limb, including shoulder: Secondary | ICD-10-CM | POA: Diagnosis not present

## 2017-04-18 DIAGNOSIS — C44622 Squamous cell carcinoma of skin of right upper limb, including shoulder: Secondary | ICD-10-CM | POA: Diagnosis not present

## 2017-05-10 DIAGNOSIS — Z7901 Long term (current) use of anticoagulants: Secondary | ICD-10-CM | POA: Diagnosis not present

## 2017-05-10 DIAGNOSIS — Z5181 Encounter for therapeutic drug level monitoring: Secondary | ICD-10-CM | POA: Diagnosis not present

## 2017-05-10 DIAGNOSIS — I739 Peripheral vascular disease, unspecified: Secondary | ICD-10-CM | POA: Diagnosis not present

## 2017-05-21 DIAGNOSIS — Z95820 Peripheral vascular angioplasty status with implants and grafts: Secondary | ICD-10-CM | POA: Diagnosis not present

## 2017-05-21 DIAGNOSIS — I6523 Occlusion and stenosis of bilateral carotid arteries: Secondary | ICD-10-CM | POA: Diagnosis not present

## 2017-05-21 DIAGNOSIS — I743 Embolism and thrombosis of arteries of the lower extremities: Secondary | ICD-10-CM | POA: Diagnosis not present

## 2017-05-21 DIAGNOSIS — I739 Peripheral vascular disease, unspecified: Secondary | ICD-10-CM | POA: Diagnosis not present

## 2017-05-29 DIAGNOSIS — E663 Overweight: Secondary | ICD-10-CM | POA: Diagnosis not present

## 2017-05-29 DIAGNOSIS — Z6827 Body mass index (BMI) 27.0-27.9, adult: Secondary | ICD-10-CM | POA: Diagnosis not present

## 2017-05-29 DIAGNOSIS — J019 Acute sinusitis, unspecified: Secondary | ICD-10-CM | POA: Diagnosis not present

## 2017-05-29 DIAGNOSIS — J208 Acute bronchitis due to other specified organisms: Secondary | ICD-10-CM | POA: Diagnosis not present

## 2017-05-31 DIAGNOSIS — H6091 Unspecified otitis externa, right ear: Secondary | ICD-10-CM | POA: Diagnosis not present

## 2017-05-31 DIAGNOSIS — Z6827 Body mass index (BMI) 27.0-27.9, adult: Secondary | ICD-10-CM | POA: Diagnosis not present

## 2017-06-12 DIAGNOSIS — I10 Essential (primary) hypertension: Secondary | ICD-10-CM | POA: Diagnosis not present

## 2017-06-12 DIAGNOSIS — E782 Mixed hyperlipidemia: Secondary | ICD-10-CM | POA: Diagnosis not present

## 2017-06-12 DIAGNOSIS — N3281 Overactive bladder: Secondary | ICD-10-CM | POA: Diagnosis not present

## 2017-06-12 DIAGNOSIS — Z79899 Other long term (current) drug therapy: Secondary | ICD-10-CM | POA: Diagnosis not present

## 2017-06-12 DIAGNOSIS — R739 Hyperglycemia, unspecified: Secondary | ICD-10-CM | POA: Diagnosis not present

## 2017-06-12 DIAGNOSIS — M79674 Pain in right toe(s): Secondary | ICD-10-CM | POA: Diagnosis not present

## 2017-06-12 DIAGNOSIS — F419 Anxiety disorder, unspecified: Secondary | ICD-10-CM | POA: Diagnosis not present

## 2017-06-12 DIAGNOSIS — E039 Hypothyroidism, unspecified: Secondary | ICD-10-CM | POA: Diagnosis not present

## 2017-06-12 DIAGNOSIS — E559 Vitamin D deficiency, unspecified: Secondary | ICD-10-CM | POA: Diagnosis not present

## 2017-06-12 DIAGNOSIS — Z1231 Encounter for screening mammogram for malignant neoplasm of breast: Secondary | ICD-10-CM | POA: Diagnosis not present

## 2017-06-12 DIAGNOSIS — M7062 Trochanteric bursitis, left hip: Secondary | ICD-10-CM | POA: Diagnosis not present

## 2017-06-12 DIAGNOSIS — J309 Allergic rhinitis, unspecified: Secondary | ICD-10-CM | POA: Diagnosis not present

## 2017-06-12 DIAGNOSIS — Z6825 Body mass index (BMI) 25.0-25.9, adult: Secondary | ICD-10-CM | POA: Diagnosis not present

## 2017-07-10 DIAGNOSIS — Z7901 Long term (current) use of anticoagulants: Secondary | ICD-10-CM | POA: Diagnosis not present

## 2017-07-17 DIAGNOSIS — R791 Abnormal coagulation profile: Secondary | ICD-10-CM | POA: Diagnosis not present

## 2017-07-24 DIAGNOSIS — Z7901 Long term (current) use of anticoagulants: Secondary | ICD-10-CM | POA: Diagnosis not present

## 2017-07-31 DIAGNOSIS — Z7901 Long term (current) use of anticoagulants: Secondary | ICD-10-CM | POA: Diagnosis not present

## 2017-08-23 DIAGNOSIS — R791 Abnormal coagulation profile: Secondary | ICD-10-CM | POA: Diagnosis not present

## 2017-08-27 DIAGNOSIS — H25813 Combined forms of age-related cataract, bilateral: Secondary | ICD-10-CM | POA: Diagnosis not present

## 2017-09-06 DIAGNOSIS — Z7901 Long term (current) use of anticoagulants: Secondary | ICD-10-CM | POA: Diagnosis not present

## 2017-09-10 DIAGNOSIS — I10 Essential (primary) hypertension: Secondary | ICD-10-CM | POA: Diagnosis not present

## 2017-09-10 DIAGNOSIS — J019 Acute sinusitis, unspecified: Secondary | ICD-10-CM | POA: Diagnosis not present

## 2017-09-10 DIAGNOSIS — I739 Peripheral vascular disease, unspecified: Secondary | ICD-10-CM | POA: Diagnosis not present

## 2017-09-10 DIAGNOSIS — I2510 Atherosclerotic heart disease of native coronary artery without angina pectoris: Secondary | ICD-10-CM | POA: Diagnosis not present

## 2017-09-10 DIAGNOSIS — Z79899 Other long term (current) drug therapy: Secondary | ICD-10-CM | POA: Diagnosis not present

## 2017-09-10 DIAGNOSIS — Z6826 Body mass index (BMI) 26.0-26.9, adult: Secondary | ICD-10-CM | POA: Diagnosis not present

## 2017-09-10 DIAGNOSIS — J309 Allergic rhinitis, unspecified: Secondary | ICD-10-CM | POA: Diagnosis not present

## 2017-09-10 DIAGNOSIS — E785 Hyperlipidemia, unspecified: Secondary | ICD-10-CM | POA: Diagnosis not present

## 2017-09-10 DIAGNOSIS — E039 Hypothyroidism, unspecified: Secondary | ICD-10-CM | POA: Diagnosis not present

## 2017-09-10 DIAGNOSIS — E559 Vitamin D deficiency, unspecified: Secondary | ICD-10-CM | POA: Diagnosis not present

## 2017-09-10 DIAGNOSIS — E663 Overweight: Secondary | ICD-10-CM | POA: Diagnosis not present

## 2017-09-24 DIAGNOSIS — Z7901 Long term (current) use of anticoagulants: Secondary | ICD-10-CM | POA: Diagnosis not present

## 2017-09-25 DIAGNOSIS — Z6826 Body mass index (BMI) 26.0-26.9, adult: Secondary | ICD-10-CM | POA: Diagnosis not present

## 2017-09-25 DIAGNOSIS — K921 Melena: Secondary | ICD-10-CM | POA: Diagnosis not present

## 2017-10-17 DIAGNOSIS — C44622 Squamous cell carcinoma of skin of right upper limb, including shoulder: Secondary | ICD-10-CM | POA: Diagnosis not present

## 2017-10-25 DIAGNOSIS — R791 Abnormal coagulation profile: Secondary | ICD-10-CM | POA: Diagnosis not present

## 2017-10-29 DIAGNOSIS — C44622 Squamous cell carcinoma of skin of right upper limb, including shoulder: Secondary | ICD-10-CM | POA: Diagnosis not present

## 2017-11-01 DIAGNOSIS — R791 Abnormal coagulation profile: Secondary | ICD-10-CM | POA: Diagnosis not present

## 2017-11-15 DIAGNOSIS — R791 Abnormal coagulation profile: Secondary | ICD-10-CM | POA: Diagnosis not present

## 2017-11-21 DIAGNOSIS — R791 Abnormal coagulation profile: Secondary | ICD-10-CM | POA: Diagnosis not present

## 2017-12-05 DIAGNOSIS — R791 Abnormal coagulation profile: Secondary | ICD-10-CM | POA: Diagnosis not present

## 2017-12-11 DIAGNOSIS — E559 Vitamin D deficiency, unspecified: Secondary | ICD-10-CM | POA: Diagnosis not present

## 2017-12-11 DIAGNOSIS — Z1339 Encounter for screening examination for other mental health and behavioral disorders: Secondary | ICD-10-CM | POA: Diagnosis not present

## 2017-12-11 DIAGNOSIS — J309 Allergic rhinitis, unspecified: Secondary | ICD-10-CM | POA: Diagnosis not present

## 2017-12-11 DIAGNOSIS — K529 Noninfective gastroenteritis and colitis, unspecified: Secondary | ICD-10-CM | POA: Diagnosis not present

## 2017-12-11 DIAGNOSIS — E039 Hypothyroidism, unspecified: Secondary | ICD-10-CM | POA: Diagnosis not present

## 2017-12-11 DIAGNOSIS — I2510 Atherosclerotic heart disease of native coronary artery without angina pectoris: Secondary | ICD-10-CM | POA: Diagnosis not present

## 2017-12-11 DIAGNOSIS — E663 Overweight: Secondary | ICD-10-CM | POA: Diagnosis not present

## 2017-12-11 DIAGNOSIS — I10 Essential (primary) hypertension: Secondary | ICD-10-CM | POA: Diagnosis not present

## 2017-12-11 DIAGNOSIS — I739 Peripheral vascular disease, unspecified: Secondary | ICD-10-CM | POA: Diagnosis not present

## 2017-12-11 DIAGNOSIS — Z6826 Body mass index (BMI) 26.0-26.9, adult: Secondary | ICD-10-CM | POA: Diagnosis not present

## 2017-12-11 DIAGNOSIS — Z79899 Other long term (current) drug therapy: Secondary | ICD-10-CM | POA: Diagnosis not present

## 2017-12-11 DIAGNOSIS — E785 Hyperlipidemia, unspecified: Secondary | ICD-10-CM | POA: Diagnosis not present

## 2017-12-26 DIAGNOSIS — R791 Abnormal coagulation profile: Secondary | ICD-10-CM | POA: Diagnosis not present

## 2018-01-14 DIAGNOSIS — I7090 Unspecified atherosclerosis: Secondary | ICD-10-CM

## 2018-01-14 DIAGNOSIS — E079 Disorder of thyroid, unspecified: Secondary | ICD-10-CM

## 2018-01-14 DIAGNOSIS — F32A Depression, unspecified: Secondary | ICD-10-CM

## 2018-01-14 DIAGNOSIS — F329 Major depressive disorder, single episode, unspecified: Secondary | ICD-10-CM | POA: Insufficient documentation

## 2018-01-14 HISTORY — DX: Unspecified atherosclerosis: I70.90

## 2018-01-14 HISTORY — DX: Depression, unspecified: F32.A

## 2018-01-14 HISTORY — DX: Disorder of thyroid, unspecified: E07.9

## 2018-01-16 DIAGNOSIS — Z1231 Encounter for screening mammogram for malignant neoplasm of breast: Secondary | ICD-10-CM | POA: Diagnosis not present

## 2018-01-16 DIAGNOSIS — Z0000 Encounter for general adult medical examination without abnormal findings: Secondary | ICD-10-CM | POA: Diagnosis not present

## 2018-01-16 DIAGNOSIS — N959 Unspecified menopausal and perimenopausal disorder: Secondary | ICD-10-CM | POA: Diagnosis not present

## 2018-01-16 DIAGNOSIS — Z1331 Encounter for screening for depression: Secondary | ICD-10-CM | POA: Diagnosis not present

## 2018-01-16 DIAGNOSIS — Z9181 History of falling: Secondary | ICD-10-CM | POA: Diagnosis not present

## 2018-01-16 DIAGNOSIS — Z1339 Encounter for screening examination for other mental health and behavioral disorders: Secondary | ICD-10-CM | POA: Diagnosis not present

## 2018-01-16 DIAGNOSIS — Z139 Encounter for screening, unspecified: Secondary | ICD-10-CM | POA: Diagnosis not present

## 2018-01-16 DIAGNOSIS — E785 Hyperlipidemia, unspecified: Secondary | ICD-10-CM | POA: Diagnosis not present

## 2018-01-18 ENCOUNTER — Encounter: Payer: Self-pay | Admitting: Cardiology

## 2018-01-18 ENCOUNTER — Ambulatory Visit (INDEPENDENT_AMBULATORY_CARE_PROVIDER_SITE_OTHER): Payer: PPO | Admitting: Cardiology

## 2018-01-18 VITALS — BP 112/62 | HR 91 | Ht 61.0 in | Wt 139.0 lb

## 2018-01-18 DIAGNOSIS — I739 Peripheral vascular disease, unspecified: Secondary | ICD-10-CM | POA: Diagnosis not present

## 2018-01-18 DIAGNOSIS — Z87891 Personal history of nicotine dependence: Secondary | ICD-10-CM

## 2018-01-18 DIAGNOSIS — R0989 Other specified symptoms and signs involving the circulatory and respiratory systems: Secondary | ICD-10-CM | POA: Insufficient documentation

## 2018-01-18 DIAGNOSIS — I6523 Occlusion and stenosis of bilateral carotid arteries: Secondary | ICD-10-CM

## 2018-01-18 DIAGNOSIS — I7090 Unspecified atherosclerosis: Secondary | ICD-10-CM | POA: Diagnosis not present

## 2018-01-18 HISTORY — DX: Personal history of nicotine dependence: Z87.891

## 2018-01-18 HISTORY — DX: Other specified symptoms and signs involving the circulatory and respiratory systems: R09.89

## 2018-01-18 NOTE — Progress Notes (Signed)
Cardiology Office Note:    Date:  01/18/2018   ID:  Lisa Gilbert, DOB February 09, 1946, MRN [id number]  PCP:  Nicholos Johns, MD  Cardiologist:  Jenean Lindau, MD   Referring MD: Nicholos Johns, MD    ASSESSMENT:    1. Peripheral vascular disease ([place])   2. Asymptomatic bilateral carotid artery stenosis   3. Atherosclerosis   4. Bilateral carotid bruits    PLAN:    In order of problems listed above:  1. Secondary prevention stressed with the patient.  Importance of compliance with diet and medication stressed and she vocalized understanding.  Her lipids are followed by primary care physician in order to get a copy of those records. 2. Her blood pressure is stable. 3. In view of her chest tightness symptoms and risk factors we will do a Lexiscan sestamibi. 4. I am not sure why she is on anticoagulation.  We will try to get in touch with vascular doctor's office to see that the reason for anticoagulation is.  At this time this is followed by her primary care physician. 5. In view of bilateral carotid bruit she will have a bilateral carotid Doppler 6. Patient will be seen in follow-up appointment in 6 months or earlier if the patient has any concerns    Medication Adjustments/Labs and Tests Ordered: Current medicines are reviewed at length with the patient today.  Concerns regarding medicines are outlined above.  No orders of the defined types were placed in this encounter.  No orders of the defined types were placed in this encounter.    History of Present Illness:    Lisa Gilbert is a 72 y.o. female who is being seen today for the evaluation of atherosclerotic vascular disease next patient is a pleasant 72 year old female.  She has past medical history of essential hypertension, dyslipidemia, peripheral vascular disease.  She is an ex-smoker.  She is gives history of blood clots in the legs for which she is on anticoagulation.  She is seen at the request of Nicholos Johns, MD.  The  patient mentions to me that she has some chest tightness on exertion this is not radiating to any part of the body.  No orthopnea or PND.  At the time of my evaluation, the patient is alert awake oriented and in no distress.  Past Medical History:  Diagnosis Date  . Abnormal INR 11/30/2015  . Asymptomatic bilateral carotid artery stenosis 09/30/2015  . Atherosclerosis 01/14/2018  . Depression 01/14/2018  . Disease of thyroid gland 01/14/2018   hypothyroidism  . Encounter for therapeutic drug level monitoring 05/06/2015  . GI bleed 03/16/2015  . Incisional hernia 07/19/2016  . Long term current use of anticoagulant therapy 05/06/2015  . Peripheral vascular disease ([place]) 05/06/2015    Past Surgical History:  Procedure Laterality Date  . CAROTID STENT      Current Medications: Current Meds  Medication Sig  . alendronate (FOSAMAX) 70 MG tablet Take 1 tablet by mouth once a week.  Marland Kitchen aspirin EC 81 MG tablet Take 1 tablet by mouth daily.  Marland Kitchen atorvastatin (LIPITOR) 80 MG tablet Take 1 tablet by mouth daily.  . Biotin 1 MG CAPS Take 1 capsule by mouth daily.  Marland Kitchen buPROPion (WELLBUTRIN XL) 150 MG 24 hr tablet Take 1 tablet by mouth daily.  . cilostazol (PLETAL) 100 MG tablet Take 1 tablet by mouth 2 (two) times daily.  Marland Kitchen dicyclomine (BENTYL) 10 MG capsule Take 1 capsule by mouth 2 (two) times daily.  Marland Kitchen  donepezil (ARICEPT) 10 MG tablet Take 1 tablet by mouth daily.  Marland Kitchen levothyroxine (SYNTHROID, LEVOTHROID) 25 MCG tablet Take 1 tablet by mouth daily.  Marland Kitchen lisinopril (PRINIVIL,ZESTRIL) 5 MG tablet Take 1 tablet by mouth daily.  . nitroGLYCERIN (NITROSTAT) 0.4 MG SL tablet Place 1 tablet under the tongue every 5 (five) minutes as needed.  . Vitamin D, Ergocalciferol, (DRISDOL) 50000 units CAPS capsule Take 50,000 Units by mouth every 7 (seven) days.  Marland Kitchen warfarin (COUMADIN) 5 MG tablet Take 1 tablet by mouth every Thursday at 6pm.     Allergies:   Ticagrelor   Social History   Socioeconomic History    . Marital status: Married    Spouse name: Not on file  . Number of children: Not on file  . Years of education: Not on file  . Highest education level: Not on file  Occupational History  . Not on file  Social Needs  . Financial resource strain: Not on file  . Food insecurity:    Worry: Not on file    Inability: Not on file  . Transportation needs:    Medical: Not on file    Non-medical: Not on file  Tobacco Use  . Smoking status: Former Research scientist (life sciences)  . Smokeless tobacco: Never Used  Substance and Sexual Activity  . Alcohol use: Not on file  . Drug use: Not on file  . Sexual activity: Not on file  Lifestyle  . Physical activity:    Days per week: Not on file    Minutes per session: Not on file  . Stress: Not on file  Relationships  . Social connections:    Talks on phone: Not on file    Gets together: Not on file    Attends religious service: Not on file    Active member of club or organization: Not on file    Attends meetings of clubs or organizations: Not on file    Relationship status: Not on file  Other Topics Concern  . Not on file  Social History Narrative  . Not on file     Family History: The patient's family history includes Breast cancer in her mother; Diabetes in her brother and sister; Heart Problems in her brother and sister; Hypertension in her mother; Kidney cancer in her father.  ROS:   Please see the history of present illness.    All other systems reviewed and are negative.  EKGs/Labs/Other Studies Reviewed:    The following studies were reviewed today: EKG sinus rhythm and nonspecific ST-T changes   Recent Labs: No results found for requested labs within last 8760 hours.  Recent Lipid Panel No results found for: CHOL, TRIG, HDL, CHOLHDL, VLDL, LDLCALC, LDLDIRECT  Physical Exam:    VS:  BP 112/62 (BP Location: Right Arm, Patient Position: Sitting, Cuff Size: Normal)   Pulse 91   Ht 5\' 1"  (1.549 m)   Wt 139 lb (63 kg)   SpO2 98%   BMI  26.26 kg/m     Wt Readings from Last 3 Encounters:  01/18/18 139 lb (63 kg)     GEN: Patient is in no acute distress HEENT: Normal NECK: No JVD; No carotid bruits LYMPHATICS: No lymphadenopathy CARDIAC: S1 S2 regular, 2/6 systolic murmur at the apex. RESPIRATORY:  Clear to auscultation without rales, wheezing or rhonchi  ABDOMEN: Soft, non-tender, non-distended MUSCULOSKELETAL:  No edema; No deformity  SKIN: Warm and dry NEUROLOGIC:  Alert and oriented x 3 PSYCHIATRIC:  Normal affect  Signed, Jenean Lindau, MD  01/18/2018 3:22 PM    [place]

## 2018-01-18 NOTE — Patient Instructions (Signed)
Medication Instructions:  Your physician recommends that you continue on your current medications as directed. Please refer to the Current Medication list given to you today.  Labwork: None  Testing/Procedures: Your physician has requested that you have a carotid duplex. This test is an ultrasound of the carotid arteries in your neck. It looks at blood flow through these arteries that supply the brain with blood. Allow one hour for this exam. There are no restrictions or special instructions.  Your physician has requested that you have a lexiscan myoview. For further information please visit [email or web address]. Please follow instruction sheet, as given.  Follow-Up: Your physician recommends that you schedule a follow-up appointment in: 6 months  Any Other Special Instructions Will Be Listed Below (If Applicable).     If you need a refill on your cardiac medications before your next appointment, please call your pharmacy.   [place], RN, BSN

## 2018-01-18 NOTE — Addendum Note (Signed)
Addended by: Mattie Marlin on: 01/18/2018 03:38 PM   Modules accepted: Orders

## 2018-01-23 DIAGNOSIS — R791 Abnormal coagulation profile: Secondary | ICD-10-CM | POA: Diagnosis not present

## 2018-01-31 ENCOUNTER — Telehealth (HOSPITAL_COMMUNITY): Payer: Self-pay | Admitting: *Deleted

## 2018-01-31 NOTE — Telephone Encounter (Signed)
Attempted to call patient regarding upcoming appointment- no answer, unable to leave a message. 

## 2018-02-04 ENCOUNTER — Telehealth (HOSPITAL_COMMUNITY): Payer: Self-pay | Admitting: *Deleted

## 2018-02-04 NOTE — Telephone Encounter (Signed)
Patient given detailed instructions per Myocardial Perfusion Study Information Sheet for the test on 02/05/18 at 1100. Patient notified to arrive 15 minutes early and that it is imperative to arrive on time for appointment to keep from having the test rescheduled.  If you need to cancel or reschedule your appointment, please call the office within 24 hours of your appointment. . Patient verbalized understanding.Annikah Lovins, Ranae [place]

## 2018-02-05 ENCOUNTER — Ambulatory Visit (INDEPENDENT_AMBULATORY_CARE_PROVIDER_SITE_OTHER): Payer: PPO

## 2018-02-05 VITALS — Ht 61.0 in | Wt 139.0 lb

## 2018-02-05 DIAGNOSIS — I739 Peripheral vascular disease, unspecified: Secondary | ICD-10-CM

## 2018-02-05 DIAGNOSIS — I7090 Unspecified atherosclerosis: Secondary | ICD-10-CM

## 2018-02-05 LAB — MYOCARDIAL PERFUSION IMAGING
CHL CUP NUCLEAR SDS: 1
CHL CUP RESTING HR STRESS: 67 {beats}/min
LVDIAVOL: 35 mL (ref 46–106)
LVSYSVOL: 4 mL
NUC STRESS TID: 1.05
Peak HR: 117 {beats}/min
SRS: 1
SSS: 2

## 2018-02-05 MED ORDER — TECHNETIUM TC 99M TETROFOSMIN IV KIT
28.7000 | PACK | Freq: Once | INTRAVENOUS | Status: AC | PRN
  Administered 2018-02-05: 28.7 via INTRAVENOUS

## 2018-02-05 MED ORDER — TECHNETIUM TC 99M TETROFOSMIN IV KIT
9.0000 | PACK | Freq: Once | INTRAVENOUS | Status: AC | PRN
  Administered 2018-02-05: 9 via INTRAVENOUS

## 2018-02-05 MED ORDER — REGADENOSON 0.4 MG/5ML IV SOLN
0.4000 mg | Freq: Once | INTRAVENOUS | Status: AC
  Administered 2018-02-05: 0.4 mg via INTRAVENOUS

## 2018-02-13 DIAGNOSIS — E2839 Other primary ovarian failure: Secondary | ICD-10-CM | POA: Diagnosis not present

## 2018-02-13 DIAGNOSIS — M810 Age-related osteoporosis without current pathological fracture: Secondary | ICD-10-CM | POA: Diagnosis not present

## 2018-02-13 DIAGNOSIS — Z1231 Encounter for screening mammogram for malignant neoplasm of breast: Secondary | ICD-10-CM | POA: Diagnosis not present

## 2018-02-19 DIAGNOSIS — D485 Neoplasm of uncertain behavior of skin: Secondary | ICD-10-CM | POA: Diagnosis not present

## 2018-02-19 DIAGNOSIS — C44612 Basal cell carcinoma of skin of right upper limb, including shoulder: Secondary | ICD-10-CM | POA: Diagnosis not present

## 2018-02-19 DIAGNOSIS — D0461 Carcinoma in situ of skin of right upper limb, including shoulder: Secondary | ICD-10-CM | POA: Diagnosis not present

## 2018-02-19 DIAGNOSIS — C44622 Squamous cell carcinoma of skin of right upper limb, including shoulder: Secondary | ICD-10-CM | POA: Diagnosis not present

## 2018-02-20 DIAGNOSIS — R791 Abnormal coagulation profile: Secondary | ICD-10-CM | POA: Diagnosis not present

## 2018-03-06 ENCOUNTER — Ambulatory Visit (INDEPENDENT_AMBULATORY_CARE_PROVIDER_SITE_OTHER): Payer: PPO

## 2018-03-06 DIAGNOSIS — R0989 Other specified symptoms and signs involving the circulatory and respiratory systems: Secondary | ICD-10-CM

## 2018-03-06 DIAGNOSIS — I739 Peripheral vascular disease, unspecified: Secondary | ICD-10-CM

## 2018-03-06 NOTE — Progress Notes (Signed)
Carotid duplex exam has been performed. A stent was visualized in the right ICA with grossly normal flow hemodynamics. The left ICA was 40-59% stenosed.  Lisa Gilbert  RDCS, RVT

## 2018-03-21 DIAGNOSIS — Z7901 Long term (current) use of anticoagulants: Secondary | ICD-10-CM | POA: Diagnosis not present

## 2018-04-03 DIAGNOSIS — C44622 Squamous cell carcinoma of skin of right upper limb, including shoulder: Secondary | ICD-10-CM | POA: Diagnosis not present

## 2018-04-16 DIAGNOSIS — Z7901 Long term (current) use of anticoagulants: Secondary | ICD-10-CM | POA: Diagnosis not present

## 2018-04-16 DIAGNOSIS — I10 Essential (primary) hypertension: Secondary | ICD-10-CM | POA: Diagnosis not present

## 2018-04-16 DIAGNOSIS — M810 Age-related osteoporosis without current pathological fracture: Secondary | ICD-10-CM | POA: Diagnosis not present

## 2018-04-16 DIAGNOSIS — Z6826 Body mass index (BMI) 26.0-26.9, adult: Secondary | ICD-10-CM | POA: Diagnosis not present

## 2018-04-16 DIAGNOSIS — E039 Hypothyroidism, unspecified: Secondary | ICD-10-CM | POA: Diagnosis not present

## 2018-04-16 DIAGNOSIS — E785 Hyperlipidemia, unspecified: Secondary | ICD-10-CM | POA: Diagnosis not present

## 2018-04-16 DIAGNOSIS — I739 Peripheral vascular disease, unspecified: Secondary | ICD-10-CM | POA: Diagnosis not present

## 2018-04-16 DIAGNOSIS — E559 Vitamin D deficiency, unspecified: Secondary | ICD-10-CM | POA: Diagnosis not present

## 2018-04-16 DIAGNOSIS — Z1331 Encounter for screening for depression: Secondary | ICD-10-CM | POA: Diagnosis not present

## 2018-04-16 DIAGNOSIS — I2510 Atherosclerotic heart disease of native coronary artery without angina pectoris: Secondary | ICD-10-CM | POA: Diagnosis not present

## 2018-04-16 DIAGNOSIS — Z79899 Other long term (current) drug therapy: Secondary | ICD-10-CM | POA: Diagnosis not present

## 2018-04-16 DIAGNOSIS — J309 Allergic rhinitis, unspecified: Secondary | ICD-10-CM | POA: Diagnosis not present

## 2018-05-14 DIAGNOSIS — Z7901 Long term (current) use of anticoagulants: Secondary | ICD-10-CM | POA: Diagnosis not present

## 2018-06-18 DIAGNOSIS — M810 Age-related osteoporosis without current pathological fracture: Secondary | ICD-10-CM | POA: Diagnosis not present

## 2018-06-18 DIAGNOSIS — J309 Allergic rhinitis, unspecified: Secondary | ICD-10-CM | POA: Diagnosis not present

## 2018-06-18 DIAGNOSIS — I10 Essential (primary) hypertension: Secondary | ICD-10-CM | POA: Diagnosis not present

## 2018-06-18 DIAGNOSIS — K589 Irritable bowel syndrome without diarrhea: Secondary | ICD-10-CM | POA: Diagnosis not present

## 2018-06-18 DIAGNOSIS — E039 Hypothyroidism, unspecified: Secondary | ICD-10-CM | POA: Diagnosis not present

## 2018-06-18 DIAGNOSIS — E785 Hyperlipidemia, unspecified: Secondary | ICD-10-CM | POA: Diagnosis not present

## 2018-06-18 DIAGNOSIS — Z6826 Body mass index (BMI) 26.0-26.9, adult: Secondary | ICD-10-CM | POA: Diagnosis not present

## 2018-06-18 DIAGNOSIS — E559 Vitamin D deficiency, unspecified: Secondary | ICD-10-CM | POA: Diagnosis not present

## 2018-06-18 DIAGNOSIS — I739 Peripheral vascular disease, unspecified: Secondary | ICD-10-CM | POA: Diagnosis not present

## 2018-06-18 DIAGNOSIS — I2510 Atherosclerotic heart disease of native coronary artery without angina pectoris: Secondary | ICD-10-CM | POA: Diagnosis not present

## 2018-06-25 DIAGNOSIS — R791 Abnormal coagulation profile: Secondary | ICD-10-CM | POA: Diagnosis not present

## 2018-07-02 DIAGNOSIS — R791 Abnormal coagulation profile: Secondary | ICD-10-CM | POA: Diagnosis not present

## 2018-07-09 DIAGNOSIS — R791 Abnormal coagulation profile: Secondary | ICD-10-CM | POA: Diagnosis not present

## 2018-07-11 DIAGNOSIS — C44629 Squamous cell carcinoma of skin of left upper limb, including shoulder: Secondary | ICD-10-CM | POA: Diagnosis not present

## 2018-08-06 DIAGNOSIS — Z7901 Long term (current) use of anticoagulants: Secondary | ICD-10-CM | POA: Diagnosis not present

## 2018-08-13 DIAGNOSIS — Z7901 Long term (current) use of anticoagulants: Secondary | ICD-10-CM | POA: Diagnosis not present

## 2018-08-20 DIAGNOSIS — R791 Abnormal coagulation profile: Secondary | ICD-10-CM | POA: Diagnosis not present

## 2018-08-27 DIAGNOSIS — G8929 Other chronic pain: Secondary | ICD-10-CM | POA: Diagnosis not present

## 2018-08-27 DIAGNOSIS — R791 Abnormal coagulation profile: Secondary | ICD-10-CM | POA: Diagnosis not present

## 2018-08-27 DIAGNOSIS — M25472 Effusion, left ankle: Secondary | ICD-10-CM | POA: Diagnosis not present

## 2018-08-27 DIAGNOSIS — M25572 Pain in left ankle and joints of left foot: Secondary | ICD-10-CM | POA: Diagnosis not present

## 2018-08-27 DIAGNOSIS — M19079 Primary osteoarthritis, unspecified ankle and foot: Secondary | ICD-10-CM | POA: Diagnosis not present

## 2018-09-03 DIAGNOSIS — F0280 Dementia in other diseases classified elsewhere without behavioral disturbance: Secondary | ICD-10-CM | POA: Diagnosis not present

## 2018-09-03 DIAGNOSIS — R1310 Dysphagia, unspecified: Secondary | ICD-10-CM | POA: Diagnosis not present

## 2018-09-03 DIAGNOSIS — G119 Hereditary ataxia, unspecified: Secondary | ICD-10-CM | POA: Diagnosis not present

## 2018-09-03 DIAGNOSIS — R339 Retention of urine, unspecified: Secondary | ICD-10-CM | POA: Diagnosis not present

## 2018-09-03 DIAGNOSIS — R791 Abnormal coagulation profile: Secondary | ICD-10-CM | POA: Diagnosis not present

## 2018-09-03 DIAGNOSIS — E039 Hypothyroidism, unspecified: Secondary | ICD-10-CM | POA: Diagnosis not present

## 2018-09-03 DIAGNOSIS — E7800 Pure hypercholesterolemia, unspecified: Secondary | ICD-10-CM | POA: Diagnosis not present

## 2018-09-03 DIAGNOSIS — W19XXXD Unspecified fall, subsequent encounter: Secondary | ICD-10-CM | POA: Diagnosis not present

## 2018-09-03 DIAGNOSIS — Z87440 Personal history of urinary (tract) infections: Secondary | ICD-10-CM | POA: Diagnosis not present

## 2018-09-03 DIAGNOSIS — G309 Alzheimer's disease, unspecified: Secondary | ICD-10-CM | POA: Diagnosis not present

## 2018-09-03 DIAGNOSIS — M8949 Other hypertrophic osteoarthropathy, multiple sites: Secondary | ICD-10-CM | POA: Diagnosis not present

## 2018-09-03 DIAGNOSIS — F329 Major depressive disorder, single episode, unspecified: Secondary | ICD-10-CM | POA: Diagnosis not present

## 2018-09-03 DIAGNOSIS — Z96653 Presence of artificial knee joint, bilateral: Secondary | ICD-10-CM | POA: Diagnosis not present

## 2018-09-03 DIAGNOSIS — K219 Gastro-esophageal reflux disease without esophagitis: Secondary | ICD-10-CM | POA: Diagnosis not present

## 2018-09-03 DIAGNOSIS — S2241XD Multiple fractures of ribs, right side, subsequent encounter for fracture with routine healing: Secondary | ICD-10-CM | POA: Diagnosis not present

## 2018-09-03 DIAGNOSIS — Z853 Personal history of malignant neoplasm of breast: Secondary | ICD-10-CM | POA: Diagnosis not present

## 2018-09-03 DIAGNOSIS — D509 Iron deficiency anemia, unspecified: Secondary | ICD-10-CM | POA: Diagnosis not present

## 2018-09-03 DIAGNOSIS — M4726 Other spondylosis with radiculopathy, lumbar region: Secondary | ICD-10-CM | POA: Diagnosis not present

## 2018-09-04 DIAGNOSIS — I4819 Other persistent atrial fibrillation: Secondary | ICD-10-CM | POA: Diagnosis not present

## 2018-09-04 DIAGNOSIS — E875 Hyperkalemia: Secondary | ICD-10-CM | POA: Diagnosis not present

## 2018-09-04 DIAGNOSIS — N390 Urinary tract infection, site not specified: Secondary | ICD-10-CM | POA: Diagnosis not present

## 2018-09-07 DIAGNOSIS — C44622 Squamous cell carcinoma of skin of right upper limb, including shoulder: Secondary | ICD-10-CM | POA: Diagnosis not present

## 2018-09-10 DIAGNOSIS — R791 Abnormal coagulation profile: Secondary | ICD-10-CM | POA: Diagnosis not present

## 2018-09-17 ENCOUNTER — Other Ambulatory Visit: Payer: Self-pay

## 2018-09-17 ENCOUNTER — Telehealth: Payer: Self-pay | Admitting: Cardiology

## 2018-09-17 DIAGNOSIS — Z7901 Long term (current) use of anticoagulants: Secondary | ICD-10-CM | POA: Diagnosis not present

## 2018-09-17 MED ORDER — CILOSTAZOL 100 MG PO TABS: 100.0000 mg | ORAL_TABLET | Freq: Two times a day (BID) | ORAL | 0 refills | Status: DC

## 2018-09-17 NOTE — Telephone Encounter (Signed)
°*  STAT* If patient is at the pharmacy, call can be transferred to refill team.   1. Which medications need to be refilled? (please list name of each medication and dose if known) Cilostazol 100mg  tablet twice daily  2. Which pharmacy/location (including street and city if local pharmacy) is medication to be sent to? [facility] in [place]  3. Do they need a 30 day or 90 day supply? [place]

## 2018-09-30 DIAGNOSIS — Z01818 Encounter for other preprocedural examination: Secondary | ICD-10-CM | POA: Diagnosis not present

## 2018-09-30 DIAGNOSIS — H2511 Age-related nuclear cataract, right eye: Secondary | ICD-10-CM | POA: Diagnosis not present

## 2018-09-30 DIAGNOSIS — H2512 Age-related nuclear cataract, left eye: Secondary | ICD-10-CM | POA: Diagnosis not present

## 2018-10-01 DIAGNOSIS — Z7901 Long term (current) use of anticoagulants: Secondary | ICD-10-CM | POA: Diagnosis not present

## 2018-10-15 DIAGNOSIS — Z7901 Long term (current) use of anticoagulants: Secondary | ICD-10-CM | POA: Diagnosis not present

## 2018-10-25 DIAGNOSIS — H2511 Age-related nuclear cataract, right eye: Secondary | ICD-10-CM | POA: Diagnosis not present

## 2018-10-25 DIAGNOSIS — H25811 Combined forms of age-related cataract, right eye: Secondary | ICD-10-CM | POA: Diagnosis not present

## 2018-11-05 DIAGNOSIS — Z7901 Long term (current) use of anticoagulants: Secondary | ICD-10-CM | POA: Diagnosis not present

## 2018-11-07 ENCOUNTER — Telehealth: Payer: Self-pay | Admitting: *Deleted

## 2018-11-07 MED ORDER — NITROGLYCERIN 0.4 MG SL SUBL: 0.4000 mg | SUBLINGUAL_TABLET | SUBLINGUAL | 2 refills | Status: DC | PRN

## 2018-11-07 NOTE — Telephone Encounter (Signed)
YOUR CARDIOLOGY TEAM HAS ARRANGED FOR AN E-VISIT FOR YOUR APPOINTMENT - PLEASE REVIEW IMPORTANT INFORMATION BELOW SEVERAL DAYS PRIOR TO YOUR APPOINTMENT  Due to the recent COVID-19 pandemic, we are transitioning in-person office visits to tele-medicine visits in an effort to decrease unnecessary exposure to our patients, their families, and staff. These visits are billed to your insurance just like a normal visit is. We also encourage you to sign up for MyChart if you have not already done so. You will need a smartphone if possible. For patients that do not have this, we can still complete the visit using a regular telephone but do prefer a smartphone to enable video when possible. You may have a family member that lives with you that can help. If possible, we also ask that you have a blood pressure cuff and scale at home to measure your blood pressure, heart rate and weight prior to your scheduled appointment. Patients with clinical needs that need an in-person evaluation and testing will still be able to come to the office if absolutely necessary. If you have any questions, feel free to call our office.     YOUR PROVIDER WILL BE USING THE FOLLOWING PLATFORM TO COMPLETE YOUR VISIT: Staff: Please delete this text and fill in MyChart/[email or web address]  . IF USING MYCHART - How to Download the MyChart App to Your SmartPhone   - If Apple, go to App Store and type in MyChart in the search bar and download the app. If Android, ask patient to go to Google Play Store and type in MyChart in the search bar and download the app. The app is free but as with any other app downloads, your phone may require you to verify saved payment information or Apple/Android password.  - You will need to then log into the app with your MyChart username and password, and select [facility] as your healthcare provider to link the account.  - When it is time for your visit, go to the MyChart app, find appointments, and click Begin  Video Visit. Be sure to Select Allow for your device to access the Microphone and Camera for your visit. You will then be connected, and your provider will be with you shortly.  **If you have any issues connecting or need assistance, please contact MyChart service desk [phone] ([phone])**  **If using a computer, in order to ensure the best quality for your visit, you will need to use either of the following Internet Browsers: Google Chrome or Microsoft Edge**  . IF USING DOXIMITY or [email or web address] - The staff will give you instructions on receiving your link to join the meeting the day of your visit.      2-3 DAYS BEFORE YOUR APPOINTMENT  You will receive a telephone call from one of our HeartCare team members - your caller ID may say "Unknown caller." If this is a video visit, we will walk you through how to get the video launched on your phone. We will remind you check your blood pressure, heart rate and weight prior to your scheduled appointment. If you have an Apple Watch or Kardia, please upload any pertinent ECG strips the day before or morning of your appointment to MyChart. Our staff will also make sure you have reviewed the consent and agree to move forward with your scheduled tele-health visit.     THE DAY OF YOUR APPOINTMENT  Approximately 15 minutes prior to your scheduled appointment, you will receive a telephone call from one of HeartCare team -   your caller ID may say "Unknown caller."  Our staff will confirm medications, vital signs for the day and any symptoms you may be experiencing. Please have this information available prior to the time of visit start. It may also be helpful for you to have a pad of paper and pen handy for any instructions given during your visit. They will also walk you through joining the smartphone meeting if this is a video visit.    CONSENT FOR TELE-HEALTH VISIT - PLEASE REVIEW  I hereby voluntarily request, consent and authorize [facility] and  its employed or contracted physicians, physician assistants, nurse practitioners or other licensed health care professionals (the Practitioner), to provide me with telemedicine health care services (the "Services") as deemed necessary by the treating Practitioner. I acknowledge and consent to receive the Services by the Practitioner via telemedicine. I understand that the telemedicine visit will involve communicating with the Practitioner through live audiovisual communication technology and the disclosure of certain medical information by electronic transmission. I acknowledge that I have been given the opportunity to request an in-person assessment or other available alternative prior to the telemedicine visit and am voluntarily participating in the telemedicine visit.  I understand that I have the right to withhold or withdraw my consent to the use of telemedicine in the course of my care at any time, without affecting my right to future care or treatment, and that the Practitioner or I may terminate the telemedicine visit at any time. I understand that I have the right to inspect all information obtained and/or recorded in the course of the telemedicine visit and may receive copies of available information for a reasonable fee.  I understand that some of the potential risks of receiving the Services via telemedicine include:  Marland Kitchen Delay or interruption in medical evaluation due to technological equipment failure or disruption; . Information transmitted may not be sufficient (e.g. poor resolution of images) to allow for appropriate medical decision making by the Practitioner; and/or  . In rare instances, security protocols could fail, causing a breach of personal health information.  Furthermore, I acknowledge that it is my responsibility to provide information about my medical history, conditions and care that is complete and accurate to the best of my ability. I acknowledge that Practitioner's advice,  recommendations, and/or decision may be based on factors not within their control, such as incomplete or inaccurate data provided by me or distortions of diagnostic images or specimens that may result from electronic transmissions. I understand that the practice of medicine is not an exact science and that Practitioner makes no warranties or guarantees regarding treatment outcomes. I acknowledge that I will receive a copy of this consent concurrently upon execution via email to the email address I last provided but may also request a printed copy by calling the office of [place].    I understand that my insurance will be billed for this visit. Pt gives consent for video visit  I have read or had this consent read to me. . I understand the contents of this consent, which adequately explains the benefits and risks of the Services being provided via telemedicine.  . I have been provided ample opportunity to ask questions regarding this consent and the Services and have had my questions answered to my satisfaction. . I give my informed consent for the services to be provided through the use of telemedicine in my medical care  By participating in this telemedicine visit I agree to the above.Rx refill sent  to pharmacy.  *STAT* If patient is at the pharmacy, call can be transferred to refill team.   1. Which medications need to be refilled? (please list name of each medication and dose if known) Nitroglycerine  2. Which pharmacy/location (including street and city if local pharmacy) is medication to be sent to?[facility]  3. Do they need a 30 day or 90 day supply?

## 2018-11-08 ENCOUNTER — Telehealth (INDEPENDENT_AMBULATORY_CARE_PROVIDER_SITE_OTHER): Payer: PPO | Admitting: Cardiology

## 2018-11-08 ENCOUNTER — Encounter: Payer: Self-pay | Admitting: Cardiology

## 2018-11-08 ENCOUNTER — Other Ambulatory Visit: Payer: Self-pay

## 2018-11-08 ENCOUNTER — Other Ambulatory Visit: Payer: Self-pay | Admitting: Cardiology

## 2018-11-08 VITALS — BP 122/62 | Ht 61.0 in | Wt 142.0 lb

## 2018-11-08 DIAGNOSIS — I739 Peripheral vascular disease, unspecified: Secondary | ICD-10-CM

## 2018-11-08 DIAGNOSIS — I6523 Occlusion and stenosis of bilateral carotid arteries: Secondary | ICD-10-CM | POA: Diagnosis not present

## 2018-11-08 NOTE — Patient Instructions (Signed)
Medication Instructions:  Your physician recommends that you continue on your current medications as directed. Please refer to the Current Medication list given to you today.  If you need a refill on your cardiac medications before your next appointment, please call your pharmacy.   Lab work: NONE If you have labs (blood work) drawn today and your tests are completely normal, you will receive your results only by: Marland Kitchen MyChart Message (if you have MyChart) OR . A paper copy in the mail If you have any lab test that is abnormal or we need to change your treatment, we will call you to review the results.  Testing/Procedure YOU HAVE BEEN REFERRED to our [facility] office-Peripheral Vascular specialist. You will be contacted to schedule an appt.   Follow-Up: At [facility], you and your health needs are our priority.  As part of our continuing mission to provide you with exceptional heart care, we have created designated Provider Care Teams.  These Care Teams include your primary Cardiologist (physician) and Advanced Practice Providers (APPs -  Physician Assistants and Nurse Practitioners) who all work together to provide you with the care you need, when you need it. You will need a follow up appointment in 6 months.

## 2018-11-08 NOTE — Addendum Note (Signed)
Addended by: Beckey Rutter on: 11/08/2018 09:04 AM   Modules accepted: Orders

## 2018-11-08 NOTE — Progress Notes (Signed)
Virtual Visit via Video Note   This visit type was conducted due to national recommendations for restrictions regarding the COVID-19 Pandemic (e.g. social distancing) in an effort to limit this patient's exposure and mitigate transmission in our community.  Due to her co-morbid illnesses, this patient is at least at moderate risk for complications without adequate follow up.  This format is felt to be most appropriate for this patient at this time.  All issues noted in this document were discussed and addressed.  A limited physical exam was performed with this format.  Please refer to the patient's chart for her consent to telehealth for [facility].   Date:  11/08/2018   ID:  Lisa Gilbert, DOB Aug 29, 1945, MRN [id number]  Patient Location: Home Provider Location: Home  PCP:  Nicholos Johns, MD  Cardiologist:  No primary care provider on file.  Electrophysiologist:  None   Evaluation Performed:  Follow-Up Visit  Chief Complaint: Atherosclerotic vascular disease and peripheral vascular disease  History of Present Illness:    Lisa Gilbert is a 73 y.o. female with past medical history of carotid atherosclerotic disease post stenting and peripheral vascular disease.  Patient denies any problems at this time and takes care of activities of daily living.  No chest pain orthopnea or PND.  She does not exercise on a regular basis.  She leads a sedentary lifestyle.  At the time of my evaluation, the patient is alert awake oriented and in no distress.  The patient does not have symptoms concerning for COVID-19 infection (fever, chills, cough, or new shortness of breath).    Past Medical History:  Diagnosis Date  . Abnormal INR 11/30/2015  . Asymptomatic bilateral carotid artery stenosis 09/30/2015  . Atherosclerosis 01/14/2018  . Depression 01/14/2018  . Disease of thyroid gland 01/14/2018   hypothyroidism  . Encounter for therapeutic drug level monitoring 05/06/2015  . GI bleed  03/16/2015  . Incisional hernia 07/19/2016  . Long term current use of anticoagulant therapy 05/06/2015  . Peripheral vascular disease ([place]) 05/06/2015   Past Surgical History:  Procedure Laterality Date  . CAROTID STENT       Current Meds  Medication Sig  . alendronate (FOSAMAX) 70 MG tablet Take 1 tablet by mouth once a week.  Marland Kitchen aspirin EC 81 MG tablet Take 1 tablet by mouth daily.  Marland Kitchen atorvastatin (LIPITOR) 80 MG tablet Take 1 tablet by mouth daily.  Marland Kitchen buPROPion (WELLBUTRIN XL) 150 MG 24 hr tablet Take 1 tablet by mouth daily.  . cilostazol (PLETAL) 100 MG tablet Take 1 tablet (100 mg total) by mouth 2 (two) times daily.  Marland Kitchen dicyclomine (BENTYL) 10 MG capsule Take 1 capsule by mouth 2 (two) times daily.  Marland Kitchen donepezil (ARICEPT) 10 MG tablet Take 1 tablet by mouth daily.  Marland Kitchen levothyroxine (SYNTHROID, LEVOTHROID) 25 MCG tablet Take 1 tablet by mouth daily.  Marland Kitchen lisinopril (PRINIVIL,ZESTRIL) 5 MG tablet Take 1 tablet by mouth daily.  . nitroGLYCERIN (NITROSTAT) 0.4 MG SL tablet Place 1 tablet (0.4 mg total) under the tongue every 5 (five) minutes as needed.  . Vitamin D, Ergocalciferol, (DRISDOL) 50000 units CAPS capsule Take 50,000 Units by mouth every 7 (seven) days.  Marland Kitchen warfarin (COUMADIN) 5 MG tablet Take 1 tablet by mouth every Thursday at 6pm.     Allergies:   Ticagrelor   Social History   Tobacco Use  . Smoking status: Former Research scientist (life sciences)  . Smokeless tobacco: Never Used  Substance Use Topics  . Alcohol  use: Not on file  . Drug use: Not on file     Family Hx: The patient's family history includes Breast cancer in her mother; Diabetes in her brother and sister; Heart Problems in her brother and sister; Hypertension in her mother; Kidney cancer in her father.  ROS:   Please see the history of present illness.    As mentioned above All other systems reviewed and are negative.   Prior CV studies:   The following studies were reviewed today: Study Highlights   Nuclear stress  EF: 90%.  The left ventricular ejection fraction is hyperdynamic (>65%).  Blood pressure demonstrated a normal response to exercise.  There was no ST segment deviation noted during stress.  The study is normal.  This is a low risk study.    Summary: Right Carotid: The extracranial vessels were near-normal with only minimal wall                thickening or plaque. A stent is visualized in the ICA free of                stenosis.  Left Carotid: Velocities in the left ICA are consistent with a 40-59% stenosis.               Non-hemodynamically significant plaque noted in the CCA.  Vertebrals:  Bilateral vertebral arteries demonstrate antegrade flow. Subclavians: Normal flow hemodynamics were seen in bilateral subclavian              arteries.  *See table(s) above for measurements and observations.     Electronically signed by Shirlee More MD on 03/06/2018 at 6:12:27 PM.   Labs/Other Tests and Data Reviewed:    EKG:  No ECG reviewed.  Recent Labs: No results found for requested labs within last 8760 hours.   Recent Lipid Panel No results found for: CHOL, TRIG, HDL, CHOLHDL, LDLCALC, LDLDIRECT  Wt Readings from Last 3 Encounters:  11/08/18 142 lb (64.4 kg)  02/05/18 139 lb (63 kg)  01/18/18 139 lb (63 kg)     Objective:    Vital Signs:  BP 122/62 (BP Location: Left Arm, Patient Position: Sitting, Cuff Size: Normal)   Ht 5\' 1"  (1.549 m)   Wt 142 lb (64.4 kg)   BMI 26.83 kg/m    VITAL SIGNS:  reviewed  ASSESSMENT & PLAN:    1. Atherosclerotic vascular disease: Patient appears to be stable from a symptom standpoint.  She was advised to be more active and walk at least 30 minutes a day at least 5 days a week and she verbalizes understanding.  She has had blood work about 3 months ago with her primary care physician and we will try to get a copy of those records including lipid profile.  I discussed the findings of stress test and carotid evaluation from last  time from my office with her at extensive length. 2. Mixed dyslipidemia: Diet was discussed.  Importance of regular exercise stressed and we will obtain a copy of those records 3. Patient has carotid artery stenosis post stenting and nonobstructive disease on the other side.  She also has peripheral vascular disease and wants to get established with peripheral vascular disease specialist and we will refer her to 1 of our partners for the same. 4. Patient will be seen in follow-up appointment in 6 months or earlier if the patient has any concerns   COVID-19 Education: The signs and symptoms of COVID-19 were discussed with the patient and how  to seek care for testing (follow up with PCP or arrange E-visit).  The importance of social distancing was discussed today.  Time:   Today, I have spent 15 minutes with the patient with telehealth technology discussing the above problems.     Medication Adjustments/Labs and Tests Ordered: Current medicines are reviewed at length with the patient today.  Concerns regarding medicines are outlined above.   Tests Ordered: No orders of the defined types were placed in this encounter.   Medication Changes: No orders of the defined types were placed in this encounter.   Follow Up:  Virtual Visit or In Person in 4 month(s)  Signed, Jenean Lindau, MD  11/08/2018 8:38 AM    [place]

## 2018-11-11 ENCOUNTER — Encounter: Payer: Self-pay | Admitting: Internal Medicine

## 2018-11-18 ENCOUNTER — Telehealth: Payer: Self-pay

## 2018-11-18 NOTE — Telephone Encounter (Signed)
Patient was referred for consultation and VV has question concerning future follow up needs. Information relayed to Dr. Geraldo Pitter to return call.

## 2018-11-18 NOTE — Telephone Encounter (Signed)
Spoke to them. thanks

## 2018-11-26 DIAGNOSIS — Z7901 Long term (current) use of anticoagulants: Secondary | ICD-10-CM | POA: Diagnosis not present

## 2018-11-28 DIAGNOSIS — H2512 Age-related nuclear cataract, left eye: Secondary | ICD-10-CM | POA: Diagnosis not present

## 2018-11-28 DIAGNOSIS — H25812 Combined forms of age-related cataract, left eye: Secondary | ICD-10-CM | POA: Diagnosis not present

## 2018-12-05 DIAGNOSIS — L820 Inflamed seborrheic keratosis: Secondary | ICD-10-CM | POA: Diagnosis not present

## 2018-12-09 ENCOUNTER — Other Ambulatory Visit: Payer: Self-pay | Admitting: Cardiology

## 2018-12-11 DIAGNOSIS — E039 Hypothyroidism, unspecified: Secondary | ICD-10-CM | POA: Diagnosis not present

## 2018-12-11 DIAGNOSIS — Z6827 Body mass index (BMI) 27.0-27.9, adult: Secondary | ICD-10-CM | POA: Diagnosis not present

## 2018-12-11 DIAGNOSIS — E663 Overweight: Secondary | ICD-10-CM | POA: Diagnosis not present

## 2018-12-11 DIAGNOSIS — E785 Hyperlipidemia, unspecified: Secondary | ICD-10-CM | POA: Diagnosis not present

## 2018-12-11 DIAGNOSIS — J45909 Unspecified asthma, uncomplicated: Secondary | ICD-10-CM | POA: Diagnosis not present

## 2018-12-11 DIAGNOSIS — M25552 Pain in left hip: Secondary | ICD-10-CM | POA: Diagnosis not present

## 2018-12-11 DIAGNOSIS — M810 Age-related osteoporosis without current pathological fracture: Secondary | ICD-10-CM | POA: Diagnosis not present

## 2018-12-11 DIAGNOSIS — Z79899 Other long term (current) drug therapy: Secondary | ICD-10-CM | POA: Diagnosis not present

## 2018-12-11 DIAGNOSIS — J309 Allergic rhinitis, unspecified: Secondary | ICD-10-CM | POA: Diagnosis not present

## 2018-12-11 DIAGNOSIS — I10 Essential (primary) hypertension: Secondary | ICD-10-CM | POA: Diagnosis not present

## 2018-12-11 DIAGNOSIS — E559 Vitamin D deficiency, unspecified: Secondary | ICD-10-CM | POA: Diagnosis not present

## 2018-12-11 DIAGNOSIS — K589 Irritable bowel syndrome without diarrhea: Secondary | ICD-10-CM | POA: Diagnosis not present

## 2018-12-24 DIAGNOSIS — E559 Vitamin D deficiency, unspecified: Secondary | ICD-10-CM | POA: Diagnosis not present

## 2018-12-24 DIAGNOSIS — E039 Hypothyroidism, unspecified: Secondary | ICD-10-CM | POA: Diagnosis not present

## 2018-12-24 DIAGNOSIS — Z7901 Long term (current) use of anticoagulants: Secondary | ICD-10-CM | POA: Diagnosis not present

## 2018-12-24 DIAGNOSIS — Z79899 Other long term (current) drug therapy: Secondary | ICD-10-CM | POA: Diagnosis not present

## 2018-12-24 DIAGNOSIS — E785 Hyperlipidemia, unspecified: Secondary | ICD-10-CM | POA: Diagnosis not present

## 2019-01-20 ENCOUNTER — Other Ambulatory Visit: Payer: Self-pay

## 2019-01-20 DIAGNOSIS — I739 Peripheral vascular disease, unspecified: Secondary | ICD-10-CM

## 2019-01-21 DIAGNOSIS — Z7901 Long term (current) use of anticoagulants: Secondary | ICD-10-CM | POA: Diagnosis not present

## 2019-01-28 ENCOUNTER — Encounter: Payer: PPO | Admitting: Vascular Surgery

## 2019-01-28 ENCOUNTER — Ambulatory Visit (HOSPITAL_COMMUNITY): Payer: PPO | Attending: Internal Medicine

## 2019-02-20 DIAGNOSIS — Z7901 Long term (current) use of anticoagulants: Secondary | ICD-10-CM | POA: Diagnosis not present

## 2019-02-25 DIAGNOSIS — Z6828 Body mass index (BMI) 28.0-28.9, adult: Secondary | ICD-10-CM | POA: Diagnosis not present

## 2019-02-25 DIAGNOSIS — K432 Incisional hernia without obstruction or gangrene: Secondary | ICD-10-CM | POA: Diagnosis not present

## 2019-02-25 DIAGNOSIS — K589 Irritable bowel syndrome without diarrhea: Secondary | ICD-10-CM | POA: Diagnosis not present

## 2019-02-27 DIAGNOSIS — Z7901 Long term (current) use of anticoagulants: Secondary | ICD-10-CM | POA: Diagnosis not present

## 2019-03-03 DIAGNOSIS — K432 Incisional hernia without obstruction or gangrene: Secondary | ICD-10-CM | POA: Diagnosis not present

## 2019-03-06 DIAGNOSIS — Z7901 Long term (current) use of anticoagulants: Secondary | ICD-10-CM | POA: Diagnosis not present

## 2019-03-12 DIAGNOSIS — K430 Incisional hernia with obstruction, without gangrene: Secondary | ICD-10-CM | POA: Diagnosis not present

## 2019-03-17 DIAGNOSIS — Z23 Encounter for immunization: Secondary | ICD-10-CM | POA: Diagnosis not present

## 2019-03-19 DIAGNOSIS — Z01818 Encounter for other preprocedural examination: Secondary | ICD-10-CM | POA: Diagnosis not present

## 2019-03-19 DIAGNOSIS — Z9181 History of falling: Secondary | ICD-10-CM | POA: Diagnosis not present

## 2019-03-19 DIAGNOSIS — Z6827 Body mass index (BMI) 27.0-27.9, adult: Secondary | ICD-10-CM | POA: Diagnosis not present

## 2019-03-19 DIAGNOSIS — K589 Irritable bowel syndrome without diarrhea: Secondary | ICD-10-CM | POA: Diagnosis not present

## 2019-03-19 DIAGNOSIS — I2510 Atherosclerotic heart disease of native coronary artery without angina pectoris: Secondary | ICD-10-CM | POA: Diagnosis not present

## 2019-03-19 DIAGNOSIS — Z6828 Body mass index (BMI) 28.0-28.9, adult: Secondary | ICD-10-CM | POA: Diagnosis not present

## 2019-03-19 DIAGNOSIS — K432 Incisional hernia without obstruction or gangrene: Secondary | ICD-10-CM | POA: Diagnosis not present

## 2019-03-19 DIAGNOSIS — Z7901 Long term (current) use of anticoagulants: Secondary | ICD-10-CM | POA: Diagnosis not present

## 2019-03-24 ENCOUNTER — Encounter: Payer: Self-pay | Admitting: Cardiology

## 2019-03-24 ENCOUNTER — Ambulatory Visit (INDEPENDENT_AMBULATORY_CARE_PROVIDER_SITE_OTHER): Payer: PPO | Admitting: Cardiology

## 2019-03-24 ENCOUNTER — Other Ambulatory Visit: Payer: Self-pay

## 2019-03-24 VITALS — BP 112/62 | HR 95 | Ht 61.0 in | Wt 144.6 lb

## 2019-03-24 DIAGNOSIS — Z87891 Personal history of nicotine dependence: Secondary | ICD-10-CM | POA: Diagnosis not present

## 2019-03-24 DIAGNOSIS — I739 Peripheral vascular disease, unspecified: Secondary | ICD-10-CM

## 2019-03-24 DIAGNOSIS — I6523 Occlusion and stenosis of bilateral carotid arteries: Secondary | ICD-10-CM | POA: Diagnosis not present

## 2019-03-24 DIAGNOSIS — R079 Chest pain, unspecified: Secondary | ICD-10-CM | POA: Diagnosis not present

## 2019-03-24 DIAGNOSIS — I7090 Unspecified atherosclerosis: Secondary | ICD-10-CM

## 2019-03-24 DIAGNOSIS — Z01810 Encounter for preprocedural cardiovascular examination: Secondary | ICD-10-CM | POA: Diagnosis not present

## 2019-03-24 DIAGNOSIS — I25119 Atherosclerotic heart disease of native coronary artery with unspecified angina pectoris: Secondary | ICD-10-CM | POA: Diagnosis not present

## 2019-03-24 DIAGNOSIS — Z7901 Long term (current) use of anticoagulants: Secondary | ICD-10-CM

## 2019-03-24 DIAGNOSIS — I10 Essential (primary) hypertension: Secondary | ICD-10-CM

## 2019-03-24 HISTORY — DX: Long term (current) use of anticoagulants: Z79.01

## 2019-03-24 NOTE — Progress Notes (Signed)
Cardiology Office Note:    Date:  03/24/2019   ID:  Lisa Gilbert, DOB 05-Feb-1946, MRN [id number]  PCP:  Nicholos Johns, MD  Cardiologist:  Berniece Salines, DO  Electrophysiologist:  None   Referring MD: Nicholos Johns, MD   Follow-up visit  History of Present Illness:    Lisa Gilbert is a 73 y.o. female with a hx of hypertension, hyperlipidemia, coronary disease status post stent, carotid atherosclerotic disease post stenting and peripheral vascular disease and hypothyroidism.  The patient is also on chronic anticoagulant therapy.  The patient presents today for planned  laproscopic hernia surgery on March 31, 2019.  She states that she is here for preoperative clearance.  She tells me that she has been doing well at home however as we talking more details she does the mowing of her lawn and has experienced some intermittent chest pain and shortness of breath with this.    No other complaint at this time.  Past Medical History:  Diagnosis Date  . Abnormal INR 11/30/2015  . Asymptomatic bilateral carotid artery stenosis 09/30/2015  . Atherosclerosis 01/14/2018  . Depression 01/14/2018  . Disease of thyroid gland 01/14/2018   hypothyroidism  . Encounter for therapeutic drug level monitoring 05/06/2015  . GI bleed 03/16/2015  . Incisional hernia 07/19/2016  . Long term current use of anticoagulant therapy 05/06/2015  . Peripheral vascular disease ([place]) 05/06/2015    Past Surgical History:  Procedure Laterality Date  . CAROTID STENT    . heart stent      Current Medications: Current Meds  Medication Sig  . alendronate (FOSAMAX) 70 MG tablet Take 1 tablet by mouth once a week.  Marland Kitchen aspirin EC 81 MG tablet Take 1 tablet by mouth daily.  Marland Kitchen atorvastatin (LIPITOR) 80 MG tablet Take 1 tablet by mouth daily.  Marland Kitchen buPROPion (WELLBUTRIN XL) 150 MG 24 hr tablet Take 1 tablet by mouth daily.  . cilostazol (PLETAL) 100 MG tablet Take 1 tablet (100 mg total) by mouth 2 (two) times daily.  Marland Kitchen  dicyclomine (BENTYL) 10 MG capsule Take 1 capsule by mouth 2 (two) times daily.  Marland Kitchen donepezil (ARICEPT) 10 MG tablet Take 1 tablet by mouth daily.  Marland Kitchen levothyroxine (SYNTHROID, LEVOTHROID) 25 MCG tablet Take 1 tablet by mouth daily.  Marland Kitchen lisinopril (PRINIVIL,ZESTRIL) 5 MG tablet Take 1 tablet by mouth daily.  Marland Kitchen NITROSTAT 0.4 MG SL tablet PLACE 1 TABLET UNDER THE TONGUE EVERY 5 MINUTES FOR UP TO 3 DOSES AS NEEDED FOR CHEST PAIN.CALL 911 IF PAIN PERSISTS.  Marland Kitchen Vitamin D, Ergocalciferol, (DRISDOL) 50000 units CAPS capsule Take 50,000 Units by mouth every 7 (seven) days.  Marland Kitchen warfarin (COUMADIN) 5 MG tablet Take 1 tablet by mouth every Thursday at 6pm.     Allergies:   Ticagrelor   Social History   Socioeconomic History  . Marital status: Married    Spouse name: Not on file  . Number of children: Not on file  . Years of education: Not on file  . Highest education level: Not on file  Occupational History  . Not on file  Social Needs  . Financial resource strain: Not on file  . Food insecurity    Worry: Not on file    Inability: Not on file  . Transportation needs    Medical: Not on file    Non-medical: Not on file  Tobacco Use  . Smoking status: Former Research scientist (life sciences)  . Smokeless tobacco: Never Used  Substance and Sexual Activity  .  Alcohol use: Not on file  . Drug use: Yes    Types: PCP  . Sexual activity: Not on file  Lifestyle  . Physical activity    Days per week: Not on file    Minutes per session: Not on file  . Stress: Not on file  Relationships  . Social Herbalist on phone: Not on file    Gets together: Not on file    Attends religious service: Not on file    Active member of club or organization: Not on file    Attends meetings of clubs or organizations: Not on file    Relationship status: Not on file  Other Topics Concern  . Not on file  Social History Narrative  . Not on file     Family History: The patient's family history includes Breast cancer in her  mother; Diabetes in her brother and sister; Heart Problems in her brother and sister; Hypertension in her mother; Kidney cancer in her father.  ROS:   Review of Systems  Constitution: Negative for decreased appetite, fever and weight gain.  HENT: Negative for congestion, ear discharge, hoarse voice and sore throat.   Eyes: Negative for discharge, redness, vision loss in right eye and visual halos.  Cardiovascular: Negative for chest pain, dyspnea on exertion, leg swelling, orthopnea and palpitations.  Respiratory: Negative for cough, hemoptysis, shortness of breath and snoring.   Endocrine: Negative for heat intolerance and polyphagia.  Hematologic/Lymphatic: Negative for bleeding problem. Does not bruise/bleed easily.  Skin: Negative for flushing, nail changes, rash and suspicious lesions.  Musculoskeletal: Negative for arthritis, joint pain, muscle cramps, myalgias, neck pain and stiffness.  Gastrointestinal: Negative for abdominal pain, bowel incontinence, diarrhea and excessive appetite.  Genitourinary: Negative for decreased libido, genital sores and incomplete emptying.  Neurological: Negative for brief paralysis, focal weakness, headaches and loss of balance.  Psychiatric/Behavioral: Negative for altered mental status, depression and suicidal ideas.  Allergic/Immunologic: Negative for HIV exposure and persistent infections.    EKGs/Labs/Other Studies Reviewed:    The following studies were reviewed today:   EKG:  The ekg ordered today demonstrates sinus rhythm, heart rate 95 bpm  Neurologic negative stress test February 05, 2018  Nuclear stress EF: 90%.  The left ventricular ejection fraction is hyperdynamic (>65%).  Blood pressure demonstrated a normal response to exercise.  There was no ST segment deviation noted during stress.  The study is normal.  This is a low risk study.  Recent Labs: No results found for requested labs within last 8760 hours.  Recent Lipid  Panel No results found for: CHOL, TRIG, HDL, CHOLHDL, VLDL, LDLCALC, LDLDIRECT  Physical Exam:    VS:  BP 112/62   Pulse 95   Ht 5\' 1"  (1.549 m)   Wt 144 lb 9.6 oz (65.6 kg)   SpO2 97%   BMI 27.32 kg/m     Wt Readings from Last 3 Encounters:  03/24/19 144 lb 9.6 oz (65.6 kg)  11/08/18 142 lb (64.4 kg)  02/05/18 139 lb (63 kg)     GEN: Well nourished, well developed in no acute distress HEENT: Normal NECK: No JVD; No carotid bruits LYMPHATICS: No lymphadenopathy CARDIAC: S1S2 noted,RRR, no murmurs, rubs, gallops RESPIRATORY:  Clear to auscultation without rales, wheezing or rhonchi  ABDOMEN: Soft, non-tender, non-distended, +bowel sounds, no guarding. EXTREMITIES: No edema, No cyanosis, no clubbing MUSCULOSKELETAL:  No edema; No deformity  SKIN: Warm and dry NEUROLOGIC:  Alert and oriented x 3, non-focal PSYCHIATRIC:  Normal affect, good insight  ASSESSMENT:    1. Asymptomatic bilateral carotid artery stenosis   2. Atherosclerosis   3. Peripheral vascular disease ([place])   4. Ex-smoker   5. Coronary artery disease involving native coronary artery of native heart with angina pectoris ([place])   6. Essential hypertension   7. Anticoagulated on Coumadin   8. Preoperative cardiovascular examination    PLAN:    1.  Preoperative clearance-the patient does have a plan laparoscopic hernia surgery on March 31, 2019.  Intermittent chest pain with exertion, at this time is referred to go to a pharmacologic nuclear stress test in this high risk patient prior to her procedure.  The patient was educated that this test she has had this in the past and she is agreeable to proceed.    If her stress test is negative she will be able to proceed with her procedure.  At that time prior to her procedure she can hold her warfarin for 5 days prior to her procedure and restart her warfarin as soon as possible post procedure.  2. CAD-with concern for angina, for now should continue aspirin 81 mg  daily, atorvastatin 80 mg daily.  She does have as needed sublingual nitroglycerin.  I review of the protocol with patient today.  Pharmacologic stress test as above.  3.  Hypertension -blood pressure acceptable in the office today.  We will continue her current regimen of lisinopril 5 mg daily.  4.  Peripheral vascular disease-she is on aspirin, statin, as well as warfarin 5 mg daily  The patient is in agreement with the above plan. The patient left the office in stable condition.  The patient will follow up in 3 months.   Medication Adjustments/Labs and Tests Ordered: Current medicines are reviewed at length with the patient today.  Concerns regarding medicines are outlined above.  Orders Placed This Encounter  Procedures  . Ambulatory referral to Anticoagulation Monitoring  . EKG 12-Lead   No orders of the defined types were placed in this encounter.   Patient Instructions  Medication Instructions:  Your physician recommends that you continue on your current medications as directed. Please refer to the Current Medication list given to you today.  *If you need a refill on your cardiac medications before your next appointment, please call your pharmacy*  Lab Work: None If you have labs (blood work) drawn today and your tests are completely normal, you will receive your results only by: Marland Kitchen MyChart Message (if you have MyChart) OR . A paper copy in the mail If you have any lab test that is abnormal or we need to change your treatment, we will call you to review the results.  Testing/Procedures: Your physician has requested that you have a lexiscan myoview. For further information please visit [email or web address]. Please follow instruction sheet, as given.  You will be called for a stress test date and time at [place] are being referred to the [facility]. Someone will call you to set up appointment date and time.  Follow-Up: At [facility], you and your  health needs are our priority.  As part of our continuing mission to provide you with exceptional heart care, we have created designated Provider Care Teams.  These Care Teams include your primary Cardiologist (physician) and Advanced Practice Providers (APPs -  Physician Assistants and Nurse Practitioners) who all work together to provide you with the care you need, when you need it.  Your next appointment:   3 months  The format for your next appointment:   In Person  Provider:     Other Instructions      Adopting a Healthy Lifestyle.  Know what a healthy weight is for you (roughly BMI <25) and aim to maintain this   Aim for 7+ servings of fruits and vegetables daily   65-80+ fluid ounces of water or unsweet tea for healthy kidneys   Limit to max 1 drink of alcohol per day; avoid smoking/tobacco   Limit animal fats in diet for cholesterol and heart health - choose grass fed whenever available   Avoid highly processed foods, and foods high in saturated/trans fats   Aim for low stress - take time to unwind and care for your mental health   Aim for 150 min of moderate intensity exercise weekly for heart health, and weights twice weekly for bone health   Aim for 7-9 hours of sleep daily   When it comes to diets, agreement about the perfect plan isnt easy to find, even among the experts. Experts at the [employer] developed an idea known as the Healthy Eating Plate. Just imagine a plate divided into logical, healthy portions.   The emphasis is on diet quality:   Load up on vegetables and fruits - one-half of your plate: Aim for color and variety, and remember that potatoes dont count.   Go for whole grains - one-quarter of your plate: Whole wheat, barley, wheat berries, quinoa, oats, brown rice, and foods made with them. If you want pasta, go with whole wheat pasta.   Protein power - one-quarter of your plate: Fish, chicken, beans, and nuts are all  healthy, versatile protein sources. Limit red meat.   The diet, however, does go beyond the plate, offering a few other suggestions.   Use healthy plant oils, such as olive, canola, soy, corn, sunflower and peanut. Check the labels, and avoid partially hydrogenated oil, which have unhealthy trans fats.   If youre thirsty, drink water. Coffee and tea are good in moderation, but skip sugary drinks and limit milk and dairy products to one or two daily servings.   The type of carbohydrate in the diet is more important than the amount. Some sources of carbohydrates, such as vegetables, fruits, whole grains, and beans-are healthier than others.   Finally, stay active  Signed, Berniece Salines, DO  03/24/2019 4:49 PM    [place]

## 2019-03-24 NOTE — Patient Instructions (Addendum)
Medication Instructions:  Your physician recommends that you continue on your current medications as directed. Please refer to the Current Medication list given to you today.  *If you need a refill on your cardiac medications before your next appointment, please call your pharmacy*  Lab Work: None If you have labs (blood work) drawn today and your tests are completely normal, you will receive your results only by: Marland Kitchen MyChart Message (if you have MyChart) OR . A paper copy in the mail If you have any lab test that is abnormal or we need to change your treatment, we will call you to review the results.  Testing/Procedures: Your physician has requested that you have a lexiscan myoview. For further information please visit [email or web address]. Please follow instruction sheet, as given.  You will be called for a stress test date and time at [place] are being referred to the [facility]. Someone will call you to set up appointment date and time.  Follow-Up: At [facility], you and your health needs are our priority.  As part of our continuing mission to provide you with exceptional heart care, we have created designated Provider Care Teams.  These Care Teams include your primary Cardiologist (physician) and Advanced Practice Providers (APPs -  Physician Assistants and Nurse Practitioners) who all work together to provide you with the care you need, when you need it.  Your next appointment:   3 months  The format for your next appointment:   In Person  Provider:     Other Instructions

## 2019-03-25 ENCOUNTER — Telehealth: Payer: Self-pay | Admitting: Pharmacist

## 2019-03-25 NOTE — Telephone Encounter (Signed)
Patient to establish with coumadin clinic for short-tem after surgery until back to stable dose.   Clearance done by DR Tobb on 03/24/2019 (see OV note).  Will schedule follow up INR 2 week after surgery on Nov/24/2020

## 2019-03-25 NOTE — Addendum Note (Signed)
Addended by: Particia Nearing B on: 03/25/2019 03:51 PM   Modules accepted: Orders

## 2019-03-25 NOTE — Telephone Encounter (Signed)
Received note of enrollment from Dr Harriet Masson.    LMOM: patient to call and discuss enrollment with our anticoagulation clinic in [place].

## 2019-03-28 DIAGNOSIS — Z01818 Encounter for other preprocedural examination: Secondary | ICD-10-CM | POA: Diagnosis not present

## 2019-03-28 DIAGNOSIS — R079 Chest pain, unspecified: Secondary | ICD-10-CM | POA: Diagnosis not present

## 2019-03-28 DIAGNOSIS — Z8679 Personal history of other diseases of the circulatory system: Secondary | ICD-10-CM | POA: Diagnosis not present

## 2019-03-31 DIAGNOSIS — Z7901 Long term (current) use of anticoagulants: Secondary | ICD-10-CM | POA: Diagnosis not present

## 2019-03-31 DIAGNOSIS — I82409 Acute embolism and thrombosis of unspecified deep veins of unspecified lower extremity: Secondary | ICD-10-CM | POA: Diagnosis not present

## 2019-03-31 DIAGNOSIS — Z79899 Other long term (current) drug therapy: Secondary | ICD-10-CM | POA: Diagnosis not present

## 2019-03-31 DIAGNOSIS — I252 Old myocardial infarction: Secondary | ICD-10-CM | POA: Diagnosis not present

## 2019-03-31 DIAGNOSIS — K660 Peritoneal adhesions (postprocedural) (postinfection): Secondary | ICD-10-CM | POA: Diagnosis not present

## 2019-03-31 DIAGNOSIS — K432 Incisional hernia without obstruction or gangrene: Secondary | ICD-10-CM | POA: Diagnosis not present

## 2019-03-31 DIAGNOSIS — Z7982 Long term (current) use of aspirin: Secondary | ICD-10-CM | POA: Diagnosis not present

## 2019-03-31 DIAGNOSIS — K589 Irritable bowel syndrome without diarrhea: Secondary | ICD-10-CM | POA: Diagnosis not present

## 2019-03-31 DIAGNOSIS — I739 Peripheral vascular disease, unspecified: Secondary | ICD-10-CM | POA: Diagnosis not present

## 2019-03-31 DIAGNOSIS — F329 Major depressive disorder, single episode, unspecified: Secondary | ICD-10-CM | POA: Diagnosis not present

## 2019-03-31 DIAGNOSIS — I2510 Atherosclerotic heart disease of native coronary artery without angina pectoris: Secondary | ICD-10-CM | POA: Diagnosis not present

## 2019-03-31 DIAGNOSIS — E785 Hyperlipidemia, unspecified: Secondary | ICD-10-CM | POA: Diagnosis not present

## 2019-03-31 DIAGNOSIS — I10 Essential (primary) hypertension: Secondary | ICD-10-CM | POA: Diagnosis not present

## 2019-03-31 DIAGNOSIS — J45909 Unspecified asthma, uncomplicated: Secondary | ICD-10-CM | POA: Diagnosis not present

## 2019-03-31 DIAGNOSIS — M810 Age-related osteoporosis without current pathological fracture: Secondary | ICD-10-CM | POA: Diagnosis not present

## 2019-03-31 HISTORY — PX: HERNIA REPAIR: SHX51

## 2019-04-07 DIAGNOSIS — Z7901 Long term (current) use of anticoagulants: Secondary | ICD-10-CM | POA: Diagnosis not present

## 2019-04-11 DIAGNOSIS — Z09 Encounter for follow-up examination after completed treatment for conditions other than malignant neoplasm: Secondary | ICD-10-CM | POA: Diagnosis not present

## 2019-04-15 ENCOUNTER — Other Ambulatory Visit: Payer: Self-pay

## 2019-04-15 ENCOUNTER — Ambulatory Visit (INDEPENDENT_AMBULATORY_CARE_PROVIDER_SITE_OTHER): Payer: PPO | Admitting: Pharmacist

## 2019-04-15 DIAGNOSIS — Z7901 Long term (current) use of anticoagulants: Secondary | ICD-10-CM

## 2019-04-15 DIAGNOSIS — Z5181 Encounter for therapeutic drug level monitoring: Secondary | ICD-10-CM | POA: Diagnosis not present

## 2019-04-15 LAB — POCT INR: INR: 3.2 — AB (ref 2.0–3.0)

## 2019-04-15 NOTE — Patient Instructions (Signed)
*  TAKE  2.5mg  (1/2 tablet) today ONLY, then continue 7.5mg  daily except 5mg  every Thursday. Next INR with PCP. (Patient is to resume warfarin follow up with PCP per Dr Terrial Rhodes plan)

## 2019-04-22 DIAGNOSIS — Z1331 Encounter for screening for depression: Secondary | ICD-10-CM | POA: Diagnosis not present

## 2019-04-22 DIAGNOSIS — Z139 Encounter for screening, unspecified: Secondary | ICD-10-CM | POA: Diagnosis not present

## 2019-04-22 DIAGNOSIS — E785 Hyperlipidemia, unspecified: Secondary | ICD-10-CM | POA: Diagnosis not present

## 2019-04-22 DIAGNOSIS — N959 Unspecified menopausal and perimenopausal disorder: Secondary | ICD-10-CM | POA: Diagnosis not present

## 2019-04-22 DIAGNOSIS — Z1231 Encounter for screening mammogram for malignant neoplasm of breast: Secondary | ICD-10-CM | POA: Diagnosis not present

## 2019-04-22 DIAGNOSIS — Z0000 Encounter for general adult medical examination without abnormal findings: Secondary | ICD-10-CM | POA: Diagnosis not present

## 2019-04-22 DIAGNOSIS — Z9181 History of falling: Secondary | ICD-10-CM | POA: Diagnosis not present

## 2019-05-07 DIAGNOSIS — Z7901 Long term (current) use of anticoagulants: Secondary | ICD-10-CM | POA: Diagnosis not present

## 2019-05-12 ENCOUNTER — Telehealth: Payer: PPO | Admitting: Cardiology

## 2019-05-13 DIAGNOSIS — Z09 Encounter for follow-up examination after completed treatment for conditions other than malignant neoplasm: Secondary | ICD-10-CM | POA: Diagnosis not present

## 2019-05-20 ENCOUNTER — Encounter: Payer: Self-pay | Admitting: Cardiology

## 2019-05-20 ENCOUNTER — Other Ambulatory Visit: Payer: Self-pay

## 2019-05-20 ENCOUNTER — Ambulatory Visit (INDEPENDENT_AMBULATORY_CARE_PROVIDER_SITE_OTHER): Payer: PPO | Admitting: Cardiology

## 2019-05-20 VITALS — BP 122/64 | HR 84 | Ht 61.0 in | Wt 144.0 lb

## 2019-05-20 DIAGNOSIS — R600 Localized edema: Secondary | ICD-10-CM | POA: Diagnosis not present

## 2019-05-20 DIAGNOSIS — E782 Mixed hyperlipidemia: Secondary | ICD-10-CM

## 2019-05-20 DIAGNOSIS — Z7901 Long term (current) use of anticoagulants: Secondary | ICD-10-CM

## 2019-05-20 DIAGNOSIS — I739 Peripheral vascular disease, unspecified: Secondary | ICD-10-CM | POA: Diagnosis not present

## 2019-05-20 DIAGNOSIS — I10 Essential (primary) hypertension: Secondary | ICD-10-CM | POA: Insufficient documentation

## 2019-05-20 HISTORY — DX: Essential (primary) hypertension: I10

## 2019-05-20 HISTORY — DX: Mixed hyperlipidemia: E78.2

## 2019-05-20 MED ORDER — POTASSIUM CHLORIDE CRYS ER 20 MEQ PO TBCR: EXTENDED_RELEASE_TABLET | ORAL | 1 refills | Status: DC

## 2019-05-20 MED ORDER — FUROSEMIDE 20 MG PO TABS: ORAL_TABLET | ORAL | 1 refills | Status: DC

## 2019-05-20 NOTE — Progress Notes (Signed)
Cardiology Office Note:    Date:  05/20/2019   ID:  Lisa Gilbert, DOB 10/01/1945, MRN [id number]  PCP:  Nicholos Johns, MD  Cardiologist:  Berniece Salines, DO  Electrophysiologist:  None   Referring MD: Nicholos Johns, MD   Follow up visit.  History of Present Illness:    Lisa Gilbert is a 73 y.o. female with a hx of hypertension, hyperlipidemia, coronary disease status post stent, carotid atherosclerotic disease post stenting and peripheral vascular disease and hypothyroidism.  The patient is also on chronic anticoagulant therapy.  I last saw the patient March 24, 2019 at that time she was planning for a laparoscopic hernia surgery.  During our visit we discussed her stress test prior to her surgery given her risk factors.  She was able to get her stress test which was normal therefore the patient proceeded with her surgery.  She is status post a hernia surgery and she is doing well.  She reports to me she has been experiencing some leg swelling.  Past Medical History:  Diagnosis Date  . Abnormal INR 11/30/2015  . Asymptomatic bilateral carotid artery stenosis 09/30/2015  . Atherosclerosis 01/14/2018  . Depression 01/14/2018  . Disease of thyroid gland 01/14/2018   hypothyroidism  . Encounter for therapeutic drug level monitoring 05/06/2015  . GI bleed 03/16/2015  . Incisional hernia 07/19/2016  . Long term current use of anticoagulant therapy 05/06/2015  . Peripheral vascular disease ([place]) 05/06/2015    Past Surgical History:  Procedure Laterality Date  . CAROTID STENT    . heart stent      Current Medications: Current Meds  Medication Sig  . alendronate (FOSAMAX) 70 MG tablet Take 1 tablet by mouth once a week.  Marland Kitchen aspirin EC 81 MG tablet Take 1 tablet by mouth daily.  Marland Kitchen atorvastatin (LIPITOR) 80 MG tablet Take 1 tablet by mouth daily.  Marland Kitchen buPROPion (WELLBUTRIN XL) 150 MG 24 hr tablet Take 1 tablet by mouth daily.  . cilostazol (PLETAL) 100 MG tablet Take 1 tablet (100 mg  total) by mouth 2 (two) times daily.  Marland Kitchen dicyclomine (BENTYL) 10 MG capsule Take 1 capsule by mouth 2 (two) times daily.  Marland Kitchen donepezil (ARICEPT) 10 MG tablet Take 1 tablet by mouth daily.  Marland Kitchen levothyroxine (SYNTHROID, LEVOTHROID) 25 MCG tablet Take 1 tablet by mouth daily.  Marland Kitchen lisinopril (PRINIVIL,ZESTRIL) 5 MG tablet Take 1 tablet by mouth daily.  Marland Kitchen NITROSTAT 0.4 MG SL tablet PLACE 1 TABLET UNDER THE TONGUE EVERY 5 MINUTES FOR UP TO 3 DOSES AS NEEDED FOR CHEST PAIN.CALL 911 IF PAIN PERSISTS.  Marland Kitchen Vitamin D, Ergocalciferol, (DRISDOL) 50000 units CAPS capsule Take 50,000 Units by mouth every 7 (seven) days.  Marland Kitchen warfarin (COUMADIN) 5 MG tablet Take 1 tablet by mouth. Take 7.5mg  daily except for 5mg  every Thursday.     Allergies:   Ticagrelor   Social History   Socioeconomic History  . Marital status: Married    Spouse name: Not on file  . Number of children: Not on file  . Years of education: Not on file  . Highest education level: Not on file  Occupational History  . Not on file  Tobacco Use  . Smoking status: Former Research scientist (life sciences)  . Smokeless tobacco: Never Used  Substance and Sexual Activity  . Alcohol use: Not on file  . Drug use: Yes    Types: PCP  . Sexual activity: Not on file  Other Topics Concern  . Not on file  Social  History Narrative  . Not on file   Social Determinants of Health   Financial Resource Strain:   . Difficulty of Paying Living Expenses: Not on file  Food Insecurity:   . Worried About Charity fundraiser in the Last Year: Not on file  . Ran Out of Food in the Last Year: Not on file  Transportation Needs:   . Lack of Transportation (Medical): Not on file  . Lack of Transportation (Non-Medical): Not on file  Physical Activity:   . Days of Exercise per Week: Not on file  . Minutes of Exercise per Session: Not on file  Stress:   . Feeling of Stress : Not on file  Social Connections:   . Frequency of Communication with Friends and Family: Not on file  .  Frequency of Social Gatherings with Friends and Family: Not on file  . Attends Religious Services: Not on file  . Active Member of Clubs or Organizations: Not on file  . Attends Archivist Meetings: Not on file  . Marital Status: Not on file     Family History: The patient's family history includes Breast cancer in her mother; Diabetes in her brother and sister; Heart Problems in her brother and sister; Hypertension in her mother; Kidney cancer in her father.  ROS:   Review of Systems  Constitution: Negative for decreased appetite, fever and weight gain.  HENT: Negative for congestion, ear discharge, hoarse voice and sore throat.   Eyes: Negative for discharge, redness, vision loss in right eye and visual halos.  Cardiovascular: Negative for chest pain, dyspnea on exertion, leg swelling, orthopnea and palpitations.  Respiratory: Negative for cough, hemoptysis, shortness of breath and snoring.   Endocrine: Negative for heat intolerance and polyphagia.  Hematologic/Lymphatic: Negative for bleeding problem. Does not bruise/bleed easily.  Skin: Negative for flushing, nail changes, rash and suspicious lesions.  Musculoskeletal: Negative for arthritis, joint pain, muscle cramps, myalgias, neck pain and stiffness.  Gastrointestinal: Negative for abdominal pain, bowel incontinence, diarrhea and excessive appetite.  Genitourinary: Negative for decreased libido, genital sores and incomplete emptying.  Neurological: Negative for brief paralysis, focal weakness, headaches and loss of balance.  Psychiatric/Behavioral: Negative for altered mental status, depression and suicidal ideas.  Allergic/Immunologic: Negative for HIV exposure and persistent infections.    EKGs/Labs/Other Studies Reviewed:    The following studies were reviewed today:   EKG: None  Recent Labs: No results found for requested labs within last 8760 hours.  Recent Lipid Panel No results found for: CHOL, TRIG, HDL,  CHOLHDL, VLDL, LDLCALC, LDLDIRECT  Physical Exam:    VS:  BP 122/64   Pulse 84   Ht 5\' 1"  (1.549 m)   Wt 144 lb (65.3 kg)   SpO2 99%   BMI 27.21 kg/m     Wt Readings from Last 3 Encounters:  05/20/19 144 lb (65.3 kg)  03/24/19 144 lb 9.6 oz (65.6 kg)  11/08/18 142 lb (64.4 kg)     GEN: Well nourished, well developed in no acute distress HEENT: Normal NECK: No JVD; No carotid bruits LYMPHATICS: No lymphadenopathy CARDIAC: S1S2 noted,RRR, no murmurs, rubs, gallops RESPIRATORY:  Clear to auscultation without rales, wheezing or rhonchi  ABDOMEN: Soft, non-tender, non-distended, +bowel sounds, no guarding. EXTREMITIES: Trace bilateral ankle edema, No cyanosis, no clubbing MUSCULOSKELETAL:  No deformity  SKIN: Warm and dry NEUROLOGIC:  Alert and oriented x 3, non-focal PSYCHIATRIC:  Normal affect, good insight  ASSESSMENT:    1. Bilateral leg edema  2. Anticoagulated on Coumadin   3. Essential hypertension   4. Mixed hyperlipidemia   5. Peripheral vascular disease ([place])    PLAN:    She appears to be doing well cardiovascular standpoint.  Since her surgery her recovery is going well.  However due to her bilateral leg edema I am going to give the patient Lasix 20 mg as needed once a week along with potassium chloride 20 mEq.  Her blood pressure is appropriate today.  She will continue to follow with the Coumadin clinic.  The patient is in agreement with the above plan. The patient left the office in stable condition.  The patient will follow up in 6 months   Medication Adjustments/Labs and Tests Ordered: Current medicines are reviewed at length with the patient today.  Concerns regarding medicines are outlined above.  No orders of the defined types were placed in this encounter.  Meds ordered this encounter  Medications  . furosemide (LASIX) 20 MG tablet    Sig: Take 1 tab (20 mg) once a week    Dispense:  15 tablet    Refill:  1  . potassium chloride SA (KLOR-CON  M20) 20 MEQ tablet    Sig: Take 1 tab (92meq) once weekly with furosemide    Dispense:  15 tablet    Refill:  1    Patient Instructions  Medication Instructions:  Your physician has recommended you make the following change in your medication:   START: Furosemide 20 mg Take 1 tab weekly START: Potassium 20 meq Take 1 tab weekly  *If you need a refill on your cardiac medications before your next appointment, please call your pharmacy*  Lab Work: None  If you have labs (blood work) drawn today and your tests are completely normal, you will receive your results only by: Marland Kitchen MyChart Message (if you have MyChart) OR . A paper copy in the mail If you have any lab test that is abnormal or we need to change your treatment, we will call you to review the results.  Testing/Procedures: None  Follow-Up: At [facility], you and your health needs are our priority.  As part of our continuing mission to provide you with exceptional heart care, we have created designated Provider Care Teams.  These Care Teams include your primary Cardiologist (physician) and Advanced Practice Providers (APPs -  Physician Assistants and Nurse Practitioners) who all work together to provide you with the care you need, when you need it.  Your next appointment:   6 month(s)  The format for your next appointment:   In Person  Provider:   Berniece Salines, DO  Other Instructions      Adopting a Healthy Lifestyle.  Know what a healthy weight is for you (roughly BMI <25) and aim to maintain this   Aim for 7+ servings of fruits and vegetables daily   65-80+ fluid ounces of water or unsweet tea for healthy kidneys   Limit to max 1 drink of alcohol per day; avoid smoking/tobacco   Limit animal fats in diet for cholesterol and heart health - choose grass fed whenever available   Avoid highly processed foods, and foods high in saturated/trans fats   Aim for low stress - take time to unwind and care for your  mental health   Aim for 150 min of moderate intensity exercise weekly for heart health, and weights twice weekly for bone health   Aim for 7-9 hours of sleep daily   When it comes  to diets, agreement about the perfect plan isnt easy to find, even among the experts. Experts at the [place] developed an idea known as the Healthy Eating Plate. Just imagine a plate divided into logical, healthy portions.   The emphasis is on diet quality:   Load up on vegetables and fruits - one-half of your plate: Aim for color and variety, and remember that potatoes dont count.   Go for whole grains - one-quarter of your plate: Whole wheat, barley, wheat berries, quinoa, oats, brown rice, and foods made with them. If you want pasta, go with whole wheat pasta.   Protein power - one-quarter of your plate: Fish, chicken, beans, and nuts are all healthy, versatile protein sources. Limit red meat.   The diet, however, does go beyond the plate, offering a few other suggestions.   Use healthy plant oils, such as olive, canola, soy, corn, sunflower and peanut. Check the labels, and avoid partially hydrogenated oil, which have unhealthy trans fats.   If youre thirsty, drink water. Coffee and tea are good in moderation, but skip sugary drinks and limit milk and dairy products to one or two daily servings.   The type of carbohydrate in the diet is more important than the amount. Some sources of carbohydrates, such as vegetables, fruits, whole grains, and beans-are healthier than others.   Finally, stay active  Signed, Berniece Salines, DO  05/20/2019 11:59 AM    [place]

## 2019-05-20 NOTE — Patient Instructions (Signed)
Medication Instructions:  Your physician has recommended you make the following change in your medication:   START: Furosemide 20 mg Take 1 tab weekly START: Potassium 20 meq Take 1 tab weekly  *If you need a refill on your cardiac medications before your next appointment, please call your pharmacy*  Lab Work: None  If you have labs (blood work) drawn today and your tests are completely normal, you will receive your results only by: Marland Kitchen MyChart Message (if you have MyChart) OR . A paper copy in the mail If you have any lab test that is abnormal or we need to change your treatment, we will call you to review the results.  Testing/Procedures: None  Follow-Up: At [facility], you and your health needs are our priority.  As part of our continuing mission to provide you with exceptional heart care, we have created designated Provider Care Teams.  These Care Teams include your primary Cardiologist (physician) and Advanced Practice Providers (APPs -  Physician Assistants and Nurse Practitioners) who all work together to provide you with the care you need, when you need it.  Your next appointment:   6 month(s)  The format for your next appointment:   In Person  Provider:   Berniece Salines, DO  Other Instructions

## 2019-05-27 DIAGNOSIS — R233 Spontaneous ecchymoses: Secondary | ICD-10-CM | POA: Diagnosis not present

## 2019-05-27 DIAGNOSIS — L821 Other seborrheic keratosis: Secondary | ICD-10-CM | POA: Diagnosis not present

## 2019-06-09 DIAGNOSIS — Z7901 Long term (current) use of anticoagulants: Secondary | ICD-10-CM | POA: Diagnosis not present

## 2019-06-10 DIAGNOSIS — Z09 Encounter for follow-up examination after completed treatment for conditions other than malignant neoplasm: Secondary | ICD-10-CM | POA: Diagnosis not present

## 2019-06-18 DIAGNOSIS — M810 Age-related osteoporosis without current pathological fracture: Secondary | ICD-10-CM | POA: Diagnosis not present

## 2019-06-18 DIAGNOSIS — I739 Peripheral vascular disease, unspecified: Secondary | ICD-10-CM | POA: Diagnosis not present

## 2019-06-18 DIAGNOSIS — E039 Hypothyroidism, unspecified: Secondary | ICD-10-CM | POA: Diagnosis not present

## 2019-06-18 DIAGNOSIS — F329 Major depressive disorder, single episode, unspecified: Secondary | ICD-10-CM | POA: Diagnosis not present

## 2019-06-18 DIAGNOSIS — R413 Other amnesia: Secondary | ICD-10-CM | POA: Diagnosis not present

## 2019-06-18 DIAGNOSIS — M79671 Pain in right foot: Secondary | ICD-10-CM | POA: Diagnosis not present

## 2019-06-18 DIAGNOSIS — E559 Vitamin D deficiency, unspecified: Secondary | ICD-10-CM | POA: Diagnosis not present

## 2019-06-18 DIAGNOSIS — K589 Irritable bowel syndrome without diarrhea: Secondary | ICD-10-CM | POA: Diagnosis not present

## 2019-06-18 DIAGNOSIS — I2510 Atherosclerotic heart disease of native coronary artery without angina pectoris: Secondary | ICD-10-CM | POA: Diagnosis not present

## 2019-06-18 DIAGNOSIS — J309 Allergic rhinitis, unspecified: Secondary | ICD-10-CM | POA: Diagnosis not present

## 2019-06-18 DIAGNOSIS — E785 Hyperlipidemia, unspecified: Secondary | ICD-10-CM | POA: Diagnosis not present

## 2019-06-18 DIAGNOSIS — I10 Essential (primary) hypertension: Secondary | ICD-10-CM | POA: Diagnosis not present

## 2019-06-20 ENCOUNTER — Encounter: Payer: Self-pay | Admitting: Sports Medicine

## 2019-06-20 ENCOUNTER — Other Ambulatory Visit: Payer: Self-pay | Admitting: Sports Medicine

## 2019-06-20 ENCOUNTER — Other Ambulatory Visit: Payer: Self-pay

## 2019-06-20 ENCOUNTER — Telehealth: Payer: Self-pay | Admitting: *Deleted

## 2019-06-20 ENCOUNTER — Ambulatory Visit: Payer: PPO | Admitting: Sports Medicine

## 2019-06-20 ENCOUNTER — Ambulatory Visit (INDEPENDENT_AMBULATORY_CARE_PROVIDER_SITE_OTHER): Payer: PPO

## 2019-06-20 DIAGNOSIS — M79671 Pain in right foot: Secondary | ICD-10-CM

## 2019-06-20 DIAGNOSIS — M216X1 Other acquired deformities of right foot: Secondary | ICD-10-CM

## 2019-06-20 DIAGNOSIS — M722 Plantar fascial fibromatosis: Secondary | ICD-10-CM

## 2019-06-20 MED ORDER — TRIAMCINOLONE ACETONIDE 10 MG/ML IJ SUSP
10.0000 mg | Freq: Once | INTRAMUSCULAR | Status: AC
  Administered 2019-06-20: 10 mg

## 2019-06-20 NOTE — Progress Notes (Signed)
Subjective: Lisa Gilbert is a 74 y.o. female patient presents to office with complaint of moderate heel pain on the right that is worse in the morning. Patient admits to post static dyskinesia for 6-8 months in duration, possibly started after beach walking, Pain 8/10 sharp in nature to bottom of heel. Patient has treated this problem with nothing with no relief. Denies any other pedal complaints.   Review of Systems  All other systems reviewed and are negative.    Patient Active Problem List   Diagnosis Date Noted  . Mixed hyperlipidemia 05/20/2019  . Essential hypertension 05/20/2019  . Anticoagulated on Coumadin 03/24/2019  . Bilateral carotid bruits 01/18/2018  . Ex-smoker 01/18/2018  . Atherosclerosis 01/14/2018  . Depression 01/14/2018  . Disease of thyroid gland 01/14/2018  . Incisional hernia 07/19/2016  . Abnormal INR 11/30/2015  . Asymptomatic bilateral carotid artery stenosis 09/30/2015  . Encounter for therapeutic drug level monitoring 05/06/2015  . Long term current use of anticoagulant therapy 05/06/2015  . Peripheral vascular disease ([place]) 05/06/2015  . GI bleed 03/16/2015    Current Outpatient Medications on File Prior to Visit  Medication Sig Dispense Refill  . alendronate (FOSAMAX) 70 MG tablet Take 1 tablet by mouth once a week.    Marland Kitchen aspirin EC 81 MG tablet Take 1 tablet by mouth daily.    Marland Kitchen atorvastatin (LIPITOR) 80 MG tablet Take 1 tablet by mouth daily.    Marland Kitchen buPROPion (WELLBUTRIN XL) 150 MG 24 hr tablet Take 1 tablet by mouth daily.    . cilostazol (PLETAL) 100 MG tablet Take 1 tablet (100 mg total) by mouth 2 (two) times daily. 180 tablet 1  . dicyclomine (BENTYL) 10 MG capsule Take 1 capsule by mouth 2 (two) times daily.    Marland Kitchen donepezil (ARICEPT) 10 MG tablet Take 1 tablet by mouth daily.    . furosemide (LASIX) 20 MG tablet Take 1 tab (20 mg) once a week 15 tablet 1  . levothyroxine (SYNTHROID, LEVOTHROID) 25 MCG tablet Take 1 tablet by mouth daily.     Marland Kitchen lisinopril (PRINIVIL,ZESTRIL) 5 MG tablet Take 1 tablet by mouth daily.    Marland Kitchen NITROSTAT 0.4 MG SL tablet PLACE 1 TABLET UNDER THE TONGUE EVERY 5 MINUTES FOR UP TO 3 DOSES AS NEEDED FOR CHEST PAIN.CALL 911 IF PAIN PERSISTS. 25 tablet 6  . potassium chloride SA (KLOR-CON M20) 20 MEQ tablet Take 1 tab (54meq) once weekly with furosemide 15 tablet 1  . Vitamin D, Ergocalciferol, (DRISDOL) 50000 units CAPS capsule Take 50,000 Units by mouth every 7 (seven) days.    Marland Kitchen warfarin (COUMADIN) 5 MG tablet Take 1 tablet by mouth. Take 7.5mg  daily except for 5mg  every Thursday.     No current facility-administered medications on file prior to visit.    Allergies  Allergen Reactions  . Ticagrelor Hives    Objective: Physical Exam General: The patient is alert and oriented x3 in no acute distress.  Dermatology: Skin is warm, dry and supple bilateral lower extremities. Nails 1-10 are normal. There is no erythema, edema, no eccymosis, no open lesions present. Integument is otherwise unremarkable.  Vascular: Dorsalis Pedis pulse and Posterior Tibial pulse are 1/4 bilateral. Capillary fill time is immediate to all digits.  Neurological: Grossly intact to light touch with an achilles reflex of +2/5 and a  negative Tinel's sign bilateral.  Musculoskeletal: Tenderness to palpation at the medial calcaneal tubercale and through the insertion of the plantar fascia on the right foot. No pain  with compression of calcaneus bilateral. No pain with tuning fork to calcaneus bilateral. No pain with calf compression bilateral. There is decreased Ankle joint range of motion bilateral. All other joints range of motion within normal limits bilateral. Strength 5/5 in all groups bilateral.   Gait: Unassisted, Antalgic avoid weight on right heel  Xray, Right foot:  Normal osseous mineralization. Joint spaces preserved. No fracture/dislocation/boney destruction. Calcaneal spur present with mild thickening of plantar fascia.  No other soft tissue abnormalities or radiopaque foreign bodies.   Assessment and Plan: Problem List Items Addressed This Visit    None    Visit Diagnoses    Plantar fasciitis of right foot    -  Primary   Right foot pain       Acquired equinus deformity of right foot          -Complete examination performed.  -Xrays reviewed -Discussed with patient in detail the condition of plantar fasciitis, how this occurs and general treatment options. Explained both conservative and surgical treatments.  -After oral consent and aseptic prep, injected a mixture containing 1 ml of 2%  plain lidocaine, 1 ml 0.5% plain marcaine, 0.5 ml of kenalog 10 and 0.5 ml of dexamethasone phosphate into right heel. Post-injection care discussed with patient.  -Recommended good supportive shoes and advised use of OTC insert. - Explained in detail the use of the fascial brace right which was dispensed at today's visit. -Explained and dispensed to patient daily stretching exercises. -Recommend patient to ice affected area 1-2x daily. -Patient to return to office in 4 weeks for follow up or sooner if problems or questions arise.  Landis Martins, DPM

## 2019-06-20 NOTE — Telephone Encounter (Signed)
"  I'm calling from [employer] with [employer].  I'm calling regarding a prior authorization request the was sent for Lisa Gilbert.  Please give me a call back to confirm some information about this prior authorization request.

## 2019-06-24 DIAGNOSIS — Z1231 Encounter for screening mammogram for malignant neoplasm of breast: Secondary | ICD-10-CM | POA: Diagnosis not present

## 2019-06-24 DIAGNOSIS — N959 Unspecified menopausal and perimenopausal disorder: Secondary | ICD-10-CM | POA: Diagnosis not present

## 2019-06-25 ENCOUNTER — Other Ambulatory Visit: Payer: Self-pay | Admitting: Sports Medicine

## 2019-06-25 DIAGNOSIS — M722 Plantar fascial fibromatosis: Secondary | ICD-10-CM

## 2019-07-02 IMAGING — NM NM CHEST EXAM
6 series · 36 of 36 positions shown · non-contrast
Comparison: none

[Series 1: rest sax · 6.4mm · 6.40mm/px · 6 of 19 frames shown]
[frame 2/19]
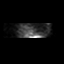
[frame 5/19]
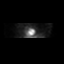
[frame 8/19]
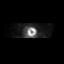
[frame 11/19]
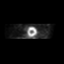
[frame 14/19]
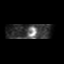
[frame 18/19]
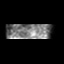

[Series 1: wbr rest · 6.40mm/px · 6 of 64 frames shown]
[frame 6/64]
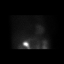
[frame 16/64]
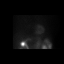
[frame 27/64]
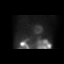
[frame 38/64]
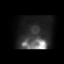
[frame 48/64]
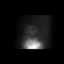
[frame 59/64]
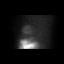

[Series 2: wbr stress-gsp · 6.40mm/px · 6 of 512 frames shown]
[frame 43/512]
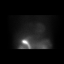
[frame 128/512]
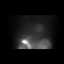
[frame 214/512]
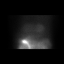
[frame 299/512]
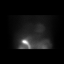
[frame 384/512]
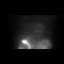
[frame 470/512]
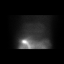

[Series 2: stress sax gs · 6.4mm · 6.40mm/px · 6 of 152 frames shown]
[frame 13/152]
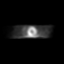
[frame 38/152]
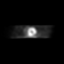
[frame 64/152]
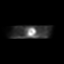
[frame 89/152]
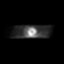
[frame 114/152]
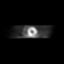
[frame 140/152]
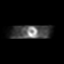

[Series 3: wbr stress-sum-em · 6.40mm/px · 6 of 64 frames shown]
[frame 6/64]
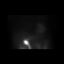
[frame 16/64]
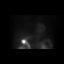
[frame 27/64]
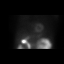
[frame 38/64]
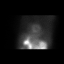
[frame 48/64]
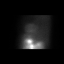
[frame 59/64]
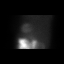

[Series 3: stress sax · 6.4mm · 6.40mm/px · 6 of 19 frames shown]
[frame 2/19]
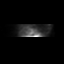
[frame 5/19]
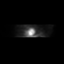
[frame 8/19]
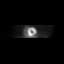
[frame 11/19]
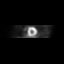
[frame 14/19]
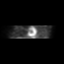
[frame 18/19]
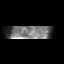

[36 of 36 positions shown; findings below may reference images not displayed]

Canned report from images found in remote index.

Refer to host system for actual result text.

## 2019-07-07 DIAGNOSIS — Z7901 Long term (current) use of anticoagulants: Secondary | ICD-10-CM | POA: Diagnosis not present

## 2019-07-08 DIAGNOSIS — Z09 Encounter for follow-up examination after completed treatment for conditions other than malignant neoplasm: Secondary | ICD-10-CM | POA: Diagnosis not present

## 2019-07-14 DIAGNOSIS — Z7901 Long term (current) use of anticoagulants: Secondary | ICD-10-CM | POA: Diagnosis not present

## 2019-07-18 ENCOUNTER — Ambulatory Visit: Payer: PPO | Admitting: Sports Medicine

## 2019-07-21 DIAGNOSIS — Z7901 Long term (current) use of anticoagulants: Secondary | ICD-10-CM | POA: Diagnosis not present

## 2019-08-04 DIAGNOSIS — Z7901 Long term (current) use of anticoagulants: Secondary | ICD-10-CM | POA: Diagnosis not present

## 2019-09-01 DIAGNOSIS — Z7901 Long term (current) use of anticoagulants: Secondary | ICD-10-CM | POA: Diagnosis not present

## 2019-09-16 DIAGNOSIS — I2510 Atherosclerotic heart disease of native coronary artery without angina pectoris: Secondary | ICD-10-CM | POA: Diagnosis not present

## 2019-09-16 DIAGNOSIS — Z79899 Other long term (current) drug therapy: Secondary | ICD-10-CM | POA: Diagnosis not present

## 2019-09-16 DIAGNOSIS — E785 Hyperlipidemia, unspecified: Secondary | ICD-10-CM | POA: Diagnosis not present

## 2019-09-16 DIAGNOSIS — M79644 Pain in right finger(s): Secondary | ICD-10-CM | POA: Diagnosis not present

## 2019-09-16 DIAGNOSIS — Z6827 Body mass index (BMI) 27.0-27.9, adult: Secondary | ICD-10-CM | POA: Diagnosis not present

## 2019-09-16 DIAGNOSIS — F329 Major depressive disorder, single episode, unspecified: Secondary | ICD-10-CM | POA: Diagnosis not present

## 2019-09-16 DIAGNOSIS — I10 Essential (primary) hypertension: Secondary | ICD-10-CM | POA: Diagnosis not present

## 2019-09-16 DIAGNOSIS — M810 Age-related osteoporosis without current pathological fracture: Secondary | ICD-10-CM | POA: Diagnosis not present

## 2019-09-16 DIAGNOSIS — E559 Vitamin D deficiency, unspecified: Secondary | ICD-10-CM | POA: Diagnosis not present

## 2019-09-16 DIAGNOSIS — M25541 Pain in joints of right hand: Secondary | ICD-10-CM | POA: Diagnosis not present

## 2019-09-16 DIAGNOSIS — E039 Hypothyroidism, unspecified: Secondary | ICD-10-CM | POA: Diagnosis not present

## 2019-09-16 DIAGNOSIS — J309 Allergic rhinitis, unspecified: Secondary | ICD-10-CM | POA: Diagnosis not present

## 2019-09-16 DIAGNOSIS — I739 Peripheral vascular disease, unspecified: Secondary | ICD-10-CM | POA: Diagnosis not present

## 2019-10-01 DIAGNOSIS — Z7901 Long term (current) use of anticoagulants: Secondary | ICD-10-CM | POA: Diagnosis not present

## 2019-10-29 ENCOUNTER — Telehealth: Payer: Self-pay

## 2019-10-29 NOTE — Telephone Encounter (Signed)
Pharmacy, can you please comment on how long Coumadin can be held for prior to colonoscopy?  Thank you!

## 2019-10-29 NOTE — Telephone Encounter (Signed)
   [facility] Pre-operative Risk Assessment    HEARTCARE STAFF: - Please ensure there is not already an duplicate clearance open for this procedure. - Under Visit Info/Reason for Call, type in Other and utilize the format Clearance MM/DD/YY or Clearance TBD. Do not use dashes or single digits. - If request is for dental extraction, please clarify the # of teeth to be extracted.  Request for surgical clearance:  1. What type of surgery is being performed? Colonoscopy   2. When is this surgery scheduled? 11-21-19   3. What type of clearance is required (medical clearance vs. Pharmacy clearance to hold med vs. Both)? Pharmacy  4. Are there any medications that need to be held prior to surgery and how long? Coumadin 5 days prior to procedure and resume the morning after the procedure.  5. Practice name and name of physician performing surgery? [facility]   6. What is the office phone number? [phone]   7.   What is the office fax number? [phone]  8.   Anesthesia type (None, local, MAC, general) ? Propofol sedation   Lisa Gilbert 10/29/2019, 3:22 PM  _________________________________________________________________   (provider comments below)

## 2019-10-29 NOTE — Telephone Encounter (Signed)
Unclear indication for anticoagulation. Dr Julien Nordmann note 12/2017 mentions blood clot in her leg however there are no details regarding this in any note or PMH. His same office visit also states "I am not sure why she is on anticoagulation. We will try to get in touch with vascular doctor's office to see that the reason for anticoagulation is. At this time this is followed by her primary care physician."   More recent notes just state that pt is on chronic anticoagulation but no indication is listed. Recommend clearance be forwarded to PCP who manages patient's warfarin for input and follow up.

## 2019-10-30 NOTE — Telephone Encounter (Signed)
Forwarded to requesting providers office 

## 2019-10-30 NOTE — Telephone Encounter (Signed)
Forwarded to PCP via EPIC fax function, for anticoag dosing

## 2019-10-30 NOTE — Telephone Encounter (Signed)
Notified pt that we forwarded this message to PCP's office for coumadin dosing for colonoscopy. Verbalizes understanding. She will call and inquire of the status of this.

## 2019-11-03 DIAGNOSIS — Z7901 Long term (current) use of anticoagulants: Secondary | ICD-10-CM | POA: Diagnosis not present

## 2019-11-10 DIAGNOSIS — M65311 Trigger thumb, right thumb: Secondary | ICD-10-CM | POA: Diagnosis not present

## 2019-11-20 DIAGNOSIS — Z1211 Encounter for screening for malignant neoplasm of colon: Secondary | ICD-10-CM | POA: Diagnosis not present

## 2019-11-20 DIAGNOSIS — Z7901 Long term (current) use of anticoagulants: Secondary | ICD-10-CM | POA: Diagnosis not present

## 2019-11-21 DIAGNOSIS — K635 Polyp of colon: Secondary | ICD-10-CM | POA: Diagnosis not present

## 2019-11-21 DIAGNOSIS — Z1211 Encounter for screening for malignant neoplasm of colon: Secondary | ICD-10-CM | POA: Diagnosis not present

## 2019-11-21 DIAGNOSIS — D123 Benign neoplasm of transverse colon: Secondary | ICD-10-CM | POA: Diagnosis not present

## 2019-11-21 DIAGNOSIS — K5730 Diverticulosis of large intestine without perforation or abscess without bleeding: Secondary | ICD-10-CM | POA: Diagnosis not present

## 2019-11-21 DIAGNOSIS — D126 Benign neoplasm of colon, unspecified: Secondary | ICD-10-CM | POA: Diagnosis not present

## 2019-11-21 DIAGNOSIS — Z86010 Personal history of colonic polyps: Secondary | ICD-10-CM | POA: Diagnosis not present

## 2019-12-01 DIAGNOSIS — M533 Sacrococcygeal disorders, not elsewhere classified: Secondary | ICD-10-CM | POA: Diagnosis not present

## 2019-12-01 DIAGNOSIS — M545 Low back pain: Secondary | ICD-10-CM | POA: Diagnosis not present

## 2019-12-01 DIAGNOSIS — Z7901 Long term (current) use of anticoagulants: Secondary | ICD-10-CM | POA: Diagnosis not present

## 2019-12-02 DIAGNOSIS — L821 Other seborrheic keratosis: Secondary | ICD-10-CM | POA: Diagnosis not present

## 2019-12-02 DIAGNOSIS — L814 Other melanin hyperpigmentation: Secondary | ICD-10-CM | POA: Diagnosis not present

## 2019-12-04 ENCOUNTER — Encounter: Payer: Self-pay | Admitting: Cardiology

## 2019-12-04 ENCOUNTER — Ambulatory Visit: Payer: PPO | Admitting: Cardiology

## 2019-12-04 ENCOUNTER — Other Ambulatory Visit: Payer: Self-pay

## 2019-12-04 VITALS — BP 102/56 | HR 89 | Ht 61.0 in | Wt 145.4 lb

## 2019-12-04 DIAGNOSIS — I6523 Occlusion and stenosis of bilateral carotid arteries: Secondary | ICD-10-CM | POA: Diagnosis not present

## 2019-12-04 DIAGNOSIS — I10 Essential (primary) hypertension: Secondary | ICD-10-CM

## 2019-12-04 DIAGNOSIS — I2510 Atherosclerotic heart disease of native coronary artery without angina pectoris: Secondary | ICD-10-CM

## 2019-12-04 DIAGNOSIS — E782 Mixed hyperlipidemia: Secondary | ICD-10-CM | POA: Diagnosis not present

## 2019-12-04 DIAGNOSIS — Z7901 Long term (current) use of anticoagulants: Secondary | ICD-10-CM | POA: Diagnosis not present

## 2019-12-04 DIAGNOSIS — I739 Peripheral vascular disease, unspecified: Secondary | ICD-10-CM

## 2019-12-04 HISTORY — DX: Atherosclerotic heart disease of native coronary artery without angina pectoris: I25.10

## 2019-12-04 NOTE — Patient Instructions (Signed)

## 2019-12-04 NOTE — Progress Notes (Signed)
Cardiology Office Note:    Date:  12/04/2019   ID:  Lisa Gilbert, DOB 08/22/1945, MRN [id number]  PCP:  Nicholos Johns, MD  Cardiologist:  Berniece Salines, DO  Electrophysiologist:  None   Referring MD: Nicholos Johns, MD   " I am doing well"   History of Present Illness:    Lisa Gilbert is a 74 y.o. female with a hx of hypertension, hyperlipidemia, coronary disease status post stent,carotid atherosclerotic disease post stenting and peripheral vascular diseaseand hypothyroidism. The patient is also on chronic anticoagulant therapy.   I did see the patient in December 2020 at that time she appeared to be doing well from a cardiovascular standpoint.  We did switch her Lasix to 20 mg as needed once a week.  Today she tells me that she has been doing well.  From a cardiovascular standpoint she has no complaints.  She does have a thumb that is being treated by the orthopedic surgeon.  She is getting injections she may need to get surgery on her thumb.   Past Medical History:  Diagnosis Date  . Abnormal INR 11/30/2015  . Asymptomatic bilateral carotid artery stenosis 09/30/2015  . Atherosclerosis 01/14/2018  . Depression 01/14/2018  . Disease of thyroid gland 01/14/2018   hypothyroidism  . Encounter for therapeutic drug level monitoring 05/06/2015  . GI bleed 03/16/2015  . Incisional hernia 07/19/2016  . Long term current use of anticoagulant therapy 05/06/2015  . Peripheral vascular disease ([place]) 05/06/2015    Past Surgical History:  Procedure Laterality Date  . CAROTID STENT    . heart stent      Current Medications: Current Meds  Medication Sig  . alendronate (FOSAMAX) 70 MG tablet Take 1 tablet by mouth once a week.  Marland Kitchen aspirin EC 81 MG tablet Take 1 tablet by mouth daily.  Marland Kitchen atorvastatin (LIPITOR) 80 MG tablet Take 1 tablet by mouth daily.  Marland Kitchen buPROPion (WELLBUTRIN XL) 150 MG 24 hr tablet Take 1 tablet by mouth daily.  . cilostazol (PLETAL) 100 MG tablet Take 1 tablet (100 mg  total) by mouth 2 (two) times daily.  Marland Kitchen dicyclomine (BENTYL) 10 MG capsule Take 1 capsule by mouth 2 (two) times daily.  Marland Kitchen donepezil (ARICEPT) 10 MG tablet Take 1 tablet by mouth daily.  . furosemide (LASIX) 20 MG tablet Take 1 tab (20 mg) once a week (Patient taking differently: Take 1 tab (20 mg) as needed)  . levothyroxine (SYNTHROID, LEVOTHROID) 25 MCG tablet Take 1 tablet by mouth daily.  Marland Kitchen lisinopril (PRINIVIL,ZESTRIL) 5 MG tablet Take 1 tablet by mouth daily.  . montelukast (SINGULAIR) 10 MG tablet Take 10 mg by mouth at bedtime.  . potassium chloride SA (KLOR-CON) 20 MEQ tablet Take 20 mEq by mouth as needed.  . Vitamin D, Ergocalciferol, (DRISDOL) 50000 units CAPS capsule Take 50,000 Units by mouth every 7 (seven) days.  Marland Kitchen warfarin (COUMADIN) 5 MG tablet Take 1 tablet by mouth. Take 7.5mg  daily except for 5mg  every Thursday.     Allergies:   Ticagrelor and Codeine   Social History   Socioeconomic History  . Marital status: Married    Spouse name: Not on file  . Number of children: Not on file  . Years of education: Not on file  . Highest education level: Not on file  Occupational History  . Not on file  Tobacco Use  . Smoking status: Former Research scientist (life sciences)  . Smokeless tobacco: Never Used  Vaping Use  . Vaping Use: Never  used  Substance and Sexual Activity  . Alcohol use: Not on file  . Drug use: Yes    Types: PCP  . Sexual activity: Not on file  Other Topics Concern  . Not on file  Social History Narrative  . Not on file   Social Determinants of Health   Financial Resource Strain:   . Difficulty of Paying Living Expenses:   Food Insecurity:   . Worried About Charity fundraiser in the Last Year:   . Arboriculturist in the Last Year:   Transportation Needs:   . Film/video editor (Medical):   Marland Kitchen Lack of Transportation (Non-Medical):   Physical Activity:   . Days of Exercise per Week:   . Minutes of Exercise per Session:   Stress:   . Feeling of Stress :     Social Connections:   . Frequency of Communication with Friends and Family:   . Frequency of Social Gatherings with Friends and Family:   . Attends Religious Services:   . Active Member of Clubs or Organizations:   . Attends Archivist Meetings:   Marland Kitchen Marital Status:      Family History: The patient's family history includes Breast cancer in her mother; Diabetes in her brother and sister; Heart Problems in her brother and sister; Hypertension in her mother; Kidney cancer in her father.  ROS:   Review of Systems  Constitution: Negative for decreased appetite, fever and weight gain.  HENT: Negative for congestion, ear discharge, hoarse voice and sore throat.   Eyes: Negative for discharge, redness, vision loss in right eye and visual halos.  Cardiovascular: Negative for chest pain, dyspnea on exertion, leg swelling, orthopnea and palpitations.  Respiratory: Negative for cough, hemoptysis, shortness of breath and snoring.   Endocrine: Negative for heat intolerance and polyphagia.  Hematologic/Lymphatic: Negative for bleeding problem. Does not bruise/bleed easily.  Skin: Negative for flushing, nail changes, rash and suspicious lesions.  Musculoskeletal: Negative for arthritis, joint pain, muscle cramps, myalgias, neck pain and stiffness.  Gastrointestinal: Negative for abdominal pain, bowel incontinence, diarrhea and excessive appetite.  Genitourinary: Negative for decreased libido, genital sores and incomplete emptying.  Neurological: Negative for brief paralysis, focal weakness, headaches and loss of balance.  Psychiatric/Behavioral: Negative for altered mental status, depression and suicidal ideas.  Allergic/Immunologic: Negative for HIV exposure and persistent infections.    EKGs/Labs/Other Studies Reviewed:    The following studies were reviewed today:   EKG:  The ekg ordered today demonstrates sinus rhythm, heart rate 62 bpm  Recent Labs: No results found for  requested labs within last 8760 hours.  Recent Lipid Panel No results found for: CHOL, TRIG, HDL, CHOLHDL, VLDL, LDLCALC, LDLDIRECT  Physical Exam:    VS:  BP (!) 102/56 (BP Location: Right Arm, Patient Position: Sitting, Cuff Size: Normal)   Pulse 89   Ht 5\' 1"  (1.549 m)   Wt 145 lb 6.4 oz (66 kg)   SpO2 96%   BMI 27.47 kg/m     Wt Readings from Last 3 Encounters:  12/04/19 145 lb 6.4 oz (66 kg)  05/20/19 144 lb (65.3 kg)  03/24/19 144 lb 9.6 oz (65.6 kg)     GEN: Well nourished, well developed in no acute distress HEENT: Normal NECK: No JVD; No carotid bruits LYMPHATICS: No lymphadenopathy CARDIAC: S1S2 noted,RRR, no murmurs, rubs, gallops RESPIRATORY:  Clear to auscultation without rales, wheezing or rhonchi  ABDOMEN: Soft, non-tender, non-distended, +bowel sounds, no guarding. EXTREMITIES: No edema,  No cyanosis, no clubbing MUSCULOSKELETAL:  No deformity  SKIN: Warm and dry NEUROLOGIC:  Alert and oriented x 3, non-focal PSYCHIATRIC:  Normal affect, good insight  ASSESSMENT:    1. Essential hypertension   2. Chronic anticoagulation   3. Peripheral vascular disease ([place])   4. Mixed hyperlipidemia   5. Asymptomatic bilateral carotid artery stenosis   6. Coronary artery disease involving native coronary artery of native heart without angina pectoris    PLAN:     She appears to be doing well from a cardiovascular standpoint.  No changes will be made to her medication regimen today.  We discussed if planning her surgery for her thumb she is cleared  from a cardiovascular standpoint.  Hypertension-her blood pressure deceptively in the office today.  She will continue to follow with the Coumadin clinic.  The patient is in agreement with the above plan. The patient left the office in stable condition.  The patient will follow up in 6 months or sooner if needed.   Medication Adjustments/Labs and Tests Ordered: Current medicines are reviewed at length with the patient  today.  Concerns regarding medicines are outlined above.  Orders Placed This Encounter  Procedures  . EKG 12-Lead   No orders of the defined types were placed in this encounter.   Patient Instructions   Medication Instructions:  No medication changes. *If you need a refill on your cardiac medications before your next appointment, please call your pharmacy*   Lab Work: None ordered If you have labs (blood work) drawn today and your tests are completely normal, you will receive your results only by: Marland Kitchen MyChart Message (if you have MyChart) OR . A paper copy in the mail If you have any lab test that is abnormal or we need to change your treatment, we will call you to review the results.   Testing/Procedures: None ordered   Follow-Up: At [facility], you and your health needs are our priority.  As part of our continuing mission to provide you with exceptional heart care, we have created designated Provider Care Teams.  These Care Teams include your primary Cardiologist (physician) and Advanced Practice Providers (APPs -  Physician Assistants and Nurse Practitioners) who all work together to provide you with the care you need, when you need it.  We recommend signing up for the patient portal called "MyChart".  Sign up information is provided on this After Visit Summary.  MyChart is used to connect with patients for Virtual Visits (Telemedicine).  Patients are able to view lab/test results, encounter notes, upcoming appointments, etc.  Non-urgent messages can be sent to your provider as well.   To learn more about what you can do with MyChart, go to [email or web address].    Your next appointment:   6 month(s)  The format for your next appointment:   In Person  Provider:   Berniece Salines, DO   Other Instructions NA     Adopting a Healthy Lifestyle.  Know what a healthy weight is for you (roughly BMI <25) and aim to maintain this   Aim for 7+ servings of fruits and  vegetables daily   65-80+ fluid ounces of water or unsweet tea for healthy kidneys   Limit to max 1 drink of alcohol per day; avoid smoking/tobacco   Limit animal fats in diet for cholesterol and heart health - choose grass fed whenever available   Avoid highly processed foods, and foods high in saturated/trans fats   Aim for low stress -  take time to unwind and care for your mental health   Aim for 150 min of moderate intensity exercise weekly for heart health, and weights twice weekly for bone health   Aim for 7-9 hours of sleep daily   When it comes to diets, agreement about the perfect plan isnt easy to find, even among the experts. Experts at the [employer] developed an idea known as the Healthy Eating Plate. Just imagine a plate divided into logical, healthy portions.   The emphasis is on diet quality:   Load up on vegetables and fruits - one-half of your plate: Aim for color and variety, and remember that potatoes dont count.   Go for whole grains - one-quarter of your plate: Whole wheat, barley, wheat berries, quinoa, oats, brown rice, and foods made with them. If you want pasta, go with whole wheat pasta.   Protein power - one-quarter of your plate: Fish, chicken, beans, and nuts are all healthy, versatile protein sources. Limit red meat.   The diet, however, does go beyond the plate, offering a few other suggestions.   Use healthy plant oils, such as olive, canola, soy, corn, sunflower and peanut. Check the labels, and avoid partially hydrogenated oil, which have unhealthy trans fats.   If youre thirsty, drink water. Coffee and tea are good in moderation, but skip sugary drinks and limit milk and dairy products to one or two daily servings.   The type of carbohydrate in the diet is more important than the amount. Some sources of carbohydrates, such as vegetables, fruits, whole grains, and beans-are healthier than others.   Finally, stay  active  Signed, Berniece Salines, DO  12/04/2019 10:02 AM    [place]

## 2019-12-09 ENCOUNTER — Telehealth: Payer: Self-pay | Admitting: *Deleted

## 2019-12-09 MED ORDER — FUROSEMIDE 20 MG PO TABS: ORAL_TABLET | ORAL | 5 refills | Status: DC

## 2019-12-09 MED ORDER — POTASSIUM CHLORIDE CRYS ER 20 MEQ PO TBCR: 20.0000 meq | EXTENDED_RELEASE_TABLET | ORAL | 5 refills | Status: DC | PRN

## 2019-12-09 NOTE — Telephone Encounter (Signed)
Rx refill sent to pharmacy. 

## 2019-12-10 DIAGNOSIS — Z7901 Long term (current) use of anticoagulants: Secondary | ICD-10-CM | POA: Diagnosis not present

## 2019-12-11 DIAGNOSIS — M65311 Trigger thumb, right thumb: Secondary | ICD-10-CM | POA: Diagnosis not present

## 2019-12-16 ENCOUNTER — Other Ambulatory Visit: Payer: Self-pay

## 2019-12-23 DIAGNOSIS — M7989 Other specified soft tissue disorders: Secondary | ICD-10-CM | POA: Diagnosis not present

## 2019-12-23 DIAGNOSIS — Z23 Encounter for immunization: Secondary | ICD-10-CM | POA: Diagnosis not present

## 2019-12-23 DIAGNOSIS — S91302A Unspecified open wound, left foot, initial encounter: Secondary | ICD-10-CM | POA: Diagnosis not present

## 2019-12-23 DIAGNOSIS — S99912A Unspecified injury of left ankle, initial encounter: Secondary | ICD-10-CM | POA: Diagnosis not present

## 2019-12-23 DIAGNOSIS — M25572 Pain in left ankle and joints of left foot: Secondary | ICD-10-CM | POA: Diagnosis not present

## 2019-12-23 DIAGNOSIS — M7732 Calcaneal spur, left foot: Secondary | ICD-10-CM | POA: Diagnosis not present

## 2019-12-25 DIAGNOSIS — E663 Overweight: Secondary | ICD-10-CM | POA: Diagnosis not present

## 2019-12-25 DIAGNOSIS — I2510 Atherosclerotic heart disease of native coronary artery without angina pectoris: Secondary | ICD-10-CM | POA: Diagnosis not present

## 2019-12-25 DIAGNOSIS — E559 Vitamin D deficiency, unspecified: Secondary | ICD-10-CM | POA: Diagnosis not present

## 2019-12-25 DIAGNOSIS — Z7901 Long term (current) use of anticoagulants: Secondary | ICD-10-CM | POA: Diagnosis not present

## 2019-12-25 DIAGNOSIS — I739 Peripheral vascular disease, unspecified: Secondary | ICD-10-CM | POA: Diagnosis not present

## 2019-12-25 DIAGNOSIS — E039 Hypothyroidism, unspecified: Secondary | ICD-10-CM | POA: Diagnosis not present

## 2019-12-25 DIAGNOSIS — Z7982 Long term (current) use of aspirin: Secondary | ICD-10-CM | POA: Diagnosis not present

## 2019-12-25 DIAGNOSIS — J45909 Unspecified asthma, uncomplicated: Secondary | ICD-10-CM | POA: Diagnosis not present

## 2019-12-25 DIAGNOSIS — Z9181 History of falling: Secondary | ICD-10-CM | POA: Diagnosis not present

## 2019-12-25 DIAGNOSIS — K589 Irritable bowel syndrome without diarrhea: Secondary | ICD-10-CM | POA: Diagnosis not present

## 2019-12-25 DIAGNOSIS — E785 Hyperlipidemia, unspecified: Secondary | ICD-10-CM | POA: Diagnosis not present

## 2019-12-25 DIAGNOSIS — S91302A Unspecified open wound, left foot, initial encounter: Secondary | ICD-10-CM | POA: Diagnosis not present

## 2019-12-25 DIAGNOSIS — F320 Major depressive disorder, single episode, mild: Secondary | ICD-10-CM | POA: Diagnosis not present

## 2019-12-25 DIAGNOSIS — M810 Age-related osteoporosis without current pathological fracture: Secondary | ICD-10-CM | POA: Diagnosis not present

## 2019-12-25 DIAGNOSIS — I10 Essential (primary) hypertension: Secondary | ICD-10-CM | POA: Diagnosis not present

## 2019-12-25 DIAGNOSIS — S91312D Laceration without foreign body, left foot, subsequent encounter: Secondary | ICD-10-CM | POA: Diagnosis not present

## 2019-12-26 ENCOUNTER — Ambulatory Visit (INDEPENDENT_AMBULATORY_CARE_PROVIDER_SITE_OTHER): Payer: PPO

## 2019-12-26 ENCOUNTER — Other Ambulatory Visit: Payer: Self-pay | Admitting: Sports Medicine

## 2019-12-26 ENCOUNTER — Telehealth: Payer: Self-pay

## 2019-12-26 ENCOUNTER — Ambulatory Visit: Payer: PPO | Admitting: Sports Medicine

## 2019-12-26 ENCOUNTER — Encounter: Payer: Self-pay | Admitting: Sports Medicine

## 2019-12-26 ENCOUNTER — Other Ambulatory Visit: Payer: Self-pay

## 2019-12-26 DIAGNOSIS — M79672 Pain in left foot: Secondary | ICD-10-CM

## 2019-12-26 DIAGNOSIS — L97521 Non-pressure chronic ulcer of other part of left foot limited to breakdown of skin: Secondary | ICD-10-CM

## 2019-12-26 DIAGNOSIS — R600 Localized edema: Secondary | ICD-10-CM | POA: Diagnosis not present

## 2019-12-26 DIAGNOSIS — S99922A Unspecified injury of left foot, initial encounter: Secondary | ICD-10-CM

## 2019-12-26 DIAGNOSIS — S92535A Nondisplaced fracture of distal phalanx of left lesser toe(s), initial encounter for closed fracture: Secondary | ICD-10-CM | POA: Diagnosis not present

## 2019-12-26 DIAGNOSIS — R58 Hemorrhage, not elsewhere classified: Secondary | ICD-10-CM | POA: Diagnosis not present

## 2019-12-26 MED ORDER — OXYCODONE-ACETAMINOPHEN 10-325 MG PO TABS: 1.0000 | ORAL_TABLET | Freq: Three times a day (TID) | ORAL | 0 refills | Status: AC | PRN

## 2019-12-26 NOTE — Progress Notes (Signed)
Subjective: Lisa Gilbert is a 74 y.o. female patient who presents to office for evaluation of left foot pain. Patient complains of pain after bumping her foot on a rocking chair on last Saturday reports that pain is sharp 10 out of 10 with stiffness at the toes and bruising across the entire foot with redness and swelling and some bloody drainage to the laceration at the top of the foot.  Patient reports that her PCP put her on Augmentin and she also went to [facility] on Tuesday where they diagnosed her with a fracture of her toes.  Patient has been using a crutch and keeping her wound clean with wound wash and applying triple antibiotic and dry dressing.  Patient denies nausea vomiting fever chills or any other constitutional symptoms at this time.  Patient Active Problem List   Diagnosis Date Noted   Coronary artery disease involving native coronary artery of native heart without angina pectoris 12/04/2019   Mixed hyperlipidemia 05/20/2019   Essential hypertension 05/20/2019   Anticoagulated on Coumadin 03/24/2019   Bilateral carotid bruits 01/18/2018   Ex-smoker 01/18/2018   Atherosclerosis 01/14/2018   Depression 01/14/2018   Disease of thyroid gland 01/14/2018   Incisional hernia 07/19/2016   Abnormal INR 11/30/2015   Asymptomatic bilateral carotid artery stenosis 09/30/2015   Encounter for therapeutic drug level monitoring 05/06/2015   Long term current use of anticoagulant therapy 05/06/2015   Peripheral vascular disease ([place]) 05/06/2015   GI bleed 03/16/2015    Current Outpatient Medications on File Prior to Visit  Medication Sig Dispense Refill   alendronate (FOSAMAX) 70 MG tablet Take 1 tablet by mouth once a week.     aspirin EC 81 MG tablet Take 1 tablet by mouth daily.     atorvastatin (LIPITOR) 80 MG tablet Take 1 tablet by mouth daily.     buPROPion (WELLBUTRIN XL) 150 MG 24 hr tablet Take 1 tablet by mouth daily.     cilostazol (PLETAL) 100  MG tablet Take 1 tablet (100 mg total) by mouth 2 (two) times daily. 180 tablet 1   dicyclomine (BENTYL) 10 MG capsule Take 1 capsule by mouth 2 (two) times daily.     donepezil (ARICEPT) 10 MG tablet Take 1 tablet by mouth daily.     furosemide (LASIX) 20 MG tablet Take 1 tab (20 mg) once a week 15 tablet 5   levothyroxine (SYNTHROID, LEVOTHROID) 25 MCG tablet Take 1 tablet by mouth daily.     lisinopril (PRINIVIL,ZESTRIL) 5 MG tablet Take 1 tablet by mouth daily.     montelukast (SINGULAIR) 10 MG tablet Take 10 mg by mouth at bedtime.     potassium chloride SA (KLOR-CON) 20 MEQ tablet Take 1 tablet (20 mEq total) by mouth as needed. 15 tablet 5   Vitamin D, Ergocalciferol, (DRISDOL) 50000 units CAPS capsule Take 50,000 Units by mouth every 7 (seven) days.     warfarin (COUMADIN) 5 MG tablet Take 1 tablet by mouth. Take 7.5mg  daily except for 5mg  every Thursday.     No current facility-administered medications on file prior to visit.    Allergies  Allergen Reactions   Ticagrelor Hives   Codeine     Objective:  General: Alert and oriented x3 in no acute distress  Dermatology: Laceration/ulceration with a fibrogranular base that measures 0.5 cm in width by 1 cm in length L-shaped with significant redness warmth swelling and clear to bloody drainage.  There is significant ecchymosis noted to the entire left  foot most concentrated at toes 2 through 5.  Nails are intact all corresponding toes left foot.  Vascular: Dorsalis Pedis and Posterior Tibial pedal pulses palpable, Capillary Fill Time 5 seconds,(+) pedal hair growth bilateral, temperature gradient mildly increased over the left foot  Neurology: Gross sensation intact via light touch bilateral, patient is hypersensitive with touch to the left foot due to pain.  Musculoskeletal: Mild to moderate tenderness with palpation to the left foot.  No pain to palpation to calf.  Gait: Cane assist antalgic gait  Xrays  Left foot    Impression: Incomplete fracture questionable of the second third and fifth toes at the proximal phalanx, diffuse swelling, no other acute fractures or acute findings.  Assessment and Plan: Problem List Items Addressed This Visit    None    Visit Diagnoses    Ulcer of left foot, limited to breakdown of skin ([place])    -  Primary   Relevant Orders   WOUND CULTURE   Foot injury, left, initial encounter       Closed nondisplaced fracture of distal phalanx of lesser toe of left foot, initial encounter       Ecchymosis       Edema of left foot       Left foot pain           -Complete examination performed -Xrays reviewed -Discussed treatment options for laceration/ulcer to left foot and toe fractures -Laceration was cleansed and Silvadene cream and dry dressing with Ace compression was applied advised patient to continue with daily wound care consisting of the same or a silver patch that she has at home already to the area if there is drainage -Wound culture was obtained from the laceration/wound bed site we will call patient if there needs to be a change with her antibiotics but for now continue with Augmentin -Prescribed Percocet for patient to take for severe episodes of pain not relieved by her current Tylenol regimen -Recommend rest ice elevation and continue with cane/crutch to assist her when she is walking -Dispensed a surgical shoe to protect and offload fracture toes -Patient to return to office in 2 weeks or sooner if condition worsens.  Landis Martins, DPM

## 2019-12-26 NOTE — Telephone Encounter (Signed)
Pt called and LVM stating oxycodone was rx to her today and it made her nauseated. Pt states she would like something else Rx

## 2019-12-26 NOTE — Telephone Encounter (Signed)
Her insurance will not allow me to prescribe another narcotic for at least 7 days. She can try breaking the pill in half and taking it with food to see if that will help.  -Dr. Chauncey Cruel

## 2019-12-29 NOTE — Telephone Encounter (Signed)
Contacted pt and advised her that per Dr. Cannon Kettle she cannot Rx another narcotic for at least 7 days. She can take the oxycodone half of it with foot and see it that helps

## 2019-12-30 ENCOUNTER — Other Ambulatory Visit: Payer: Self-pay | Admitting: Sports Medicine

## 2019-12-30 DIAGNOSIS — L97521 Non-pressure chronic ulcer of other part of left foot limited to breakdown of skin: Secondary | ICD-10-CM

## 2019-12-31 ENCOUNTER — Telehealth: Payer: Self-pay

## 2019-12-31 NOTE — Telephone Encounter (Signed)
Finish what she has. She does not need another refill -Dr. Cannon Kettle

## 2019-12-31 NOTE — Telephone Encounter (Signed)
Pt called and LVM stating she has an appt for the 18th but will run out of her abx before her appt date. Pt asks if she needs another RF? If so if it can be sent to [facility]

## 2020-01-01 NOTE — Telephone Encounter (Signed)
Called and spoke with pt and informed her that per Dr. Cannon Kettle she does not need any additional RF at this time, she is to complete what she has

## 2020-01-05 DIAGNOSIS — S92902A Unspecified fracture of left foot, initial encounter for closed fracture: Secondary | ICD-10-CM | POA: Diagnosis not present

## 2020-01-05 DIAGNOSIS — K589 Irritable bowel syndrome without diarrhea: Secondary | ICD-10-CM | POA: Diagnosis not present

## 2020-01-05 DIAGNOSIS — I2510 Atherosclerotic heart disease of native coronary artery without angina pectoris: Secondary | ICD-10-CM | POA: Diagnosis not present

## 2020-01-05 DIAGNOSIS — I739 Peripheral vascular disease, unspecified: Secondary | ICD-10-CM | POA: Diagnosis not present

## 2020-01-05 DIAGNOSIS — S91302A Unspecified open wound, left foot, initial encounter: Secondary | ICD-10-CM | POA: Diagnosis not present

## 2020-01-05 DIAGNOSIS — E785 Hyperlipidemia, unspecified: Secondary | ICD-10-CM | POA: Diagnosis not present

## 2020-01-05 DIAGNOSIS — J309 Allergic rhinitis, unspecified: Secondary | ICD-10-CM | POA: Diagnosis not present

## 2020-01-05 DIAGNOSIS — I10 Essential (primary) hypertension: Secondary | ICD-10-CM | POA: Diagnosis not present

## 2020-01-05 DIAGNOSIS — E039 Hypothyroidism, unspecified: Secondary | ICD-10-CM | POA: Diagnosis not present

## 2020-01-05 DIAGNOSIS — E559 Vitamin D deficiency, unspecified: Secondary | ICD-10-CM | POA: Diagnosis not present

## 2020-01-05 DIAGNOSIS — M810 Age-related osteoporosis without current pathological fracture: Secondary | ICD-10-CM | POA: Diagnosis not present

## 2020-01-05 DIAGNOSIS — R413 Other amnesia: Secondary | ICD-10-CM | POA: Diagnosis not present

## 2020-01-07 ENCOUNTER — Encounter: Payer: Self-pay | Admitting: Sports Medicine

## 2020-01-07 ENCOUNTER — Ambulatory Visit: Payer: PPO | Admitting: Sports Medicine

## 2020-01-07 ENCOUNTER — Ambulatory Visit (INDEPENDENT_AMBULATORY_CARE_PROVIDER_SITE_OTHER): Payer: PPO

## 2020-01-07 ENCOUNTER — Other Ambulatory Visit: Payer: Self-pay

## 2020-01-07 DIAGNOSIS — S92912A Unspecified fracture of left toe(s), initial encounter for closed fracture: Secondary | ICD-10-CM | POA: Diagnosis not present

## 2020-01-07 DIAGNOSIS — S99922D Unspecified injury of left foot, subsequent encounter: Secondary | ICD-10-CM | POA: Diagnosis not present

## 2020-01-07 DIAGNOSIS — Z7901 Long term (current) use of anticoagulants: Secondary | ICD-10-CM | POA: Diagnosis not present

## 2020-01-07 DIAGNOSIS — L97521 Non-pressure chronic ulcer of other part of left foot limited to breakdown of skin: Secondary | ICD-10-CM | POA: Diagnosis not present

## 2020-01-07 DIAGNOSIS — R58 Hemorrhage, not elsewhere classified: Secondary | ICD-10-CM

## 2020-01-07 DIAGNOSIS — M79672 Pain in left foot: Secondary | ICD-10-CM

## 2020-01-07 DIAGNOSIS — R600 Localized edema: Secondary | ICD-10-CM | POA: Diagnosis not present

## 2020-01-07 DIAGNOSIS — S92505D Nondisplaced unspecified fracture of left lesser toe(s), subsequent encounter for fracture with routine healing: Secondary | ICD-10-CM | POA: Diagnosis not present

## 2020-01-07 NOTE — Progress Notes (Signed)
Subjective: Lisa Gilbert is a 74 y.o. female patient who returns office for follow-up evaluation of laceration/wound to dorsum of left foot and toe fractures.  Patient reports that pain is slowly getting better and is currently using Tylenol for pain since she could not tolerate the Percocet.  Patient reports that she is using a crutch to help her with walking and using surgical shoe however sometimes the shoe does rub the top of the foot where she has the sore and reports that the swelling is improving.  Patient denies any other pedal complaints at this time.  Patient Active Problem List   Diagnosis Date Noted  . Coronary artery disease involving native coronary artery of native heart without angina pectoris 12/04/2019  . Mixed hyperlipidemia 05/20/2019  . Essential hypertension 05/20/2019  . Anticoagulated on Coumadin 03/24/2019  . Bilateral carotid bruits 01/18/2018  . Ex-smoker 01/18/2018  . Atherosclerosis 01/14/2018  . Depression 01/14/2018  . Disease of thyroid gland 01/14/2018  . Incisional hernia 07/19/2016  . Abnormal INR 11/30/2015  . Asymptomatic bilateral carotid artery stenosis 09/30/2015  . Encounter for therapeutic drug level monitoring 05/06/2015  . Long term current use of anticoagulant therapy 05/06/2015  . Peripheral vascular disease ([place]) 05/06/2015  . GI bleed 03/16/2015    Current Outpatient Medications on File Prior to Visit  Medication Sig Dispense Refill  . alendronate (FOSAMAX) 70 MG tablet Take 1 tablet by mouth once a week.    Marland Kitchen aspirin EC 81 MG tablet Take 1 tablet by mouth daily.    Marland Kitchen atorvastatin (LIPITOR) 80 MG tablet Take 1 tablet by mouth daily.    Marland Kitchen buPROPion (WELLBUTRIN XL) 150 MG 24 hr tablet Take 1 tablet by mouth daily.    . cilostazol (PLETAL) 100 MG tablet Take 1 tablet (100 mg total) by mouth 2 (two) times daily. 180 tablet 1  . dicyclomine (BENTYL) 10 MG capsule Take 1 capsule by mouth 2 (two) times daily.    Marland Kitchen donepezil (ARICEPT) 10 MG  tablet Take 1 tablet by mouth daily.    . furosemide (LASIX) 20 MG tablet Take 1 tab (20 mg) once a week 15 tablet 5  . levothyroxine (SYNTHROID, LEVOTHROID) 25 MCG tablet Take 1 tablet by mouth daily.    Marland Kitchen lisinopril (PRINIVIL,ZESTRIL) 5 MG tablet Take 1 tablet by mouth daily.    . montelukast (SINGULAIR) 10 MG tablet Take 10 mg by mouth at bedtime.    . potassium chloride SA (KLOR-CON) 20 MEQ tablet Take 1 tablet (20 mEq total) by mouth as needed. 15 tablet 5  . Vitamin D, Ergocalciferol, (DRISDOL) 50000 units CAPS capsule Take 50,000 Units by mouth every 7 (seven) days.    Marland Kitchen warfarin (COUMADIN) 5 MG tablet Take 1 tablet by mouth. Take 7.5mg  daily except for 5mg  every Thursday.     No current facility-administered medications on file prior to visit.    Allergies  Allergen Reactions  . Ticagrelor Hives  . Codeine     Objective:  General: Alert and oriented x3 in no acute distress  Dermatology: Laceration/ulceration with a soft eschar base that measures 0.8 x 0.5 cm circular in nature at the dorsal lateral foot, there is decreased edema, decreased erythema, decreased ecchymosis.  Nails are within normal limits.  Vascular: Dorsalis Pedis and Posterior Tibial pedal pulses palpable, Capillary Fill Time 5 seconds,(+) pedal hair growth bilateral, temperature gradient normal  Neurology: Gross sensation intact via light touch bilateral, patient is hypersensitive with touch to the left foot due  to pain but denies any numbness or tingling to toes.  Musculoskeletal: Mild tenderness with palpation to the left foot at dorsal wound and very minimal pain to lesser toes.  No pain to palpation to calf.  Gait: Cane assist antalgic gait  Xrays  Left foot   Impression: Prematurely healing fracture fractures of all lesser toes at the proximal phalanx, with the fourth toe slowly lagging behind, diffuse swelling, no other acute fractures or acute findings.  Assessment and Plan: Problem List Items  Addressed This Visit    None    Visit Diagnoses    Closed nondisplaced fracture of phalanx of lesser toe of left foot with routine healing, unspecified phalanx, subsequent encounter    -  Primary   Relevant Orders   DG Foot Complete Left (Completed)   Injury of toe on left foot, subsequent encounter       Ulcer of left foot, limited to breakdown of skin ([facility])       Ecchymosis       Edema of left foot       Left foot pain           -Complete examination performed -Xrays reviewed -Discussed continued care for laceration/ulcer to left foot and toe fractures -Cleansed with saline and applied Medihoney and dry dressing and advised patient to do the same -Continue with surgical shoe or weightbearing to heel to avoid worsening of fractures and continue with cane/crutch -Recommend continue with rest ice elevation daily until symptoms improve -Patient to return to office in 4 weeks or sooner if condition worsens.  Advised patient she may require surgical shoe until the wound is healed at the top of the foot.  Landis Martins, DPM

## 2020-01-15 DIAGNOSIS — N390 Urinary tract infection, site not specified: Secondary | ICD-10-CM | POA: Diagnosis not present

## 2020-01-21 DIAGNOSIS — Z7901 Long term (current) use of anticoagulants: Secondary | ICD-10-CM | POA: Diagnosis not present

## 2020-01-23 DIAGNOSIS — N390 Urinary tract infection, site not specified: Secondary | ICD-10-CM | POA: Diagnosis not present

## 2020-01-23 DIAGNOSIS — Z6827 Body mass index (BMI) 27.0-27.9, adult: Secondary | ICD-10-CM | POA: Diagnosis not present

## 2020-01-24 DIAGNOSIS — K589 Irritable bowel syndrome without diarrhea: Secondary | ICD-10-CM | POA: Diagnosis not present

## 2020-01-24 DIAGNOSIS — J45909 Unspecified asthma, uncomplicated: Secondary | ICD-10-CM | POA: Diagnosis not present

## 2020-01-24 DIAGNOSIS — I10 Essential (primary) hypertension: Secondary | ICD-10-CM | POA: Diagnosis not present

## 2020-01-24 DIAGNOSIS — F320 Major depressive disorder, single episode, mild: Secondary | ICD-10-CM | POA: Diagnosis not present

## 2020-01-24 DIAGNOSIS — E785 Hyperlipidemia, unspecified: Secondary | ICD-10-CM | POA: Diagnosis not present

## 2020-01-24 DIAGNOSIS — E663 Overweight: Secondary | ICD-10-CM | POA: Diagnosis not present

## 2020-01-24 DIAGNOSIS — Z7901 Long term (current) use of anticoagulants: Secondary | ICD-10-CM | POA: Diagnosis not present

## 2020-01-24 DIAGNOSIS — M810 Age-related osteoporosis without current pathological fracture: Secondary | ICD-10-CM | POA: Diagnosis not present

## 2020-01-24 DIAGNOSIS — Z9181 History of falling: Secondary | ICD-10-CM | POA: Diagnosis not present

## 2020-01-24 DIAGNOSIS — S91312D Laceration without foreign body, left foot, subsequent encounter: Secondary | ICD-10-CM | POA: Diagnosis not present

## 2020-01-24 DIAGNOSIS — I2510 Atherosclerotic heart disease of native coronary artery without angina pectoris: Secondary | ICD-10-CM | POA: Diagnosis not present

## 2020-01-24 DIAGNOSIS — E559 Vitamin D deficiency, unspecified: Secondary | ICD-10-CM | POA: Diagnosis not present

## 2020-01-24 DIAGNOSIS — E039 Hypothyroidism, unspecified: Secondary | ICD-10-CM | POA: Diagnosis not present

## 2020-01-24 DIAGNOSIS — Z7982 Long term (current) use of aspirin: Secondary | ICD-10-CM | POA: Diagnosis not present

## 2020-01-24 DIAGNOSIS — I739 Peripheral vascular disease, unspecified: Secondary | ICD-10-CM | POA: Diagnosis not present

## 2020-02-04 DIAGNOSIS — Z7901 Long term (current) use of anticoagulants: Secondary | ICD-10-CM | POA: Diagnosis not present

## 2020-02-10 ENCOUNTER — Ambulatory Visit: Payer: PPO | Admitting: Sports Medicine

## 2020-02-10 ENCOUNTER — Encounter: Payer: Self-pay | Admitting: Sports Medicine

## 2020-02-10 ENCOUNTER — Other Ambulatory Visit: Payer: Self-pay

## 2020-02-10 DIAGNOSIS — M79672 Pain in left foot: Secondary | ICD-10-CM | POA: Diagnosis not present

## 2020-02-10 DIAGNOSIS — S99922D Unspecified injury of left foot, subsequent encounter: Secondary | ICD-10-CM | POA: Diagnosis not present

## 2020-02-10 DIAGNOSIS — L97521 Non-pressure chronic ulcer of other part of left foot limited to breakdown of skin: Secondary | ICD-10-CM | POA: Diagnosis not present

## 2020-02-10 DIAGNOSIS — R600 Localized edema: Secondary | ICD-10-CM | POA: Diagnosis not present

## 2020-02-10 DIAGNOSIS — S92505D Nondisplaced unspecified fracture of left lesser toe(s), subsequent encounter for fracture with routine healing: Secondary | ICD-10-CM | POA: Diagnosis not present

## 2020-02-10 NOTE — Progress Notes (Signed)
Subjective: Lisa Gilbert is a 74 y.o. female patient who returns office for follow-up evaluation of laceration/wound to dorsum of left foot and toe fractures.  Patient reports that pain is doing much better but did notice from her adhesive tape a small rash.  Patient denies any other pedal complaints at this time.  Patient Active Problem List   Diagnosis Date Noted  . Coronary artery disease involving native coronary artery of native heart without angina pectoris 12/04/2019  . Mixed hyperlipidemia 05/20/2019  . Essential hypertension 05/20/2019  . Anticoagulated on Coumadin 03/24/2019  . Bilateral carotid bruits 01/18/2018  . Ex-smoker 01/18/2018  . Atherosclerosis 01/14/2018  . Depression 01/14/2018  . Disease of thyroid gland 01/14/2018  . Incisional hernia 07/19/2016  . Abnormal INR 11/30/2015  . Asymptomatic bilateral carotid artery stenosis 09/30/2015  . Encounter for therapeutic drug level monitoring 05/06/2015  . Long term current use of anticoagulant therapy 05/06/2015  . Peripheral vascular disease ([place]) 05/06/2015  . GI bleed 03/16/2015    Current Outpatient Medications on File Prior to Visit  Medication Sig Dispense Refill  . alendronate (FOSAMAX) 70 MG tablet Take 1 tablet by mouth once a week.    Marland Kitchen aspirin EC 81 MG tablet Take 1 tablet by mouth daily.    Marland Kitchen atorvastatin (LIPITOR) 80 MG tablet Take 1 tablet by mouth daily.    Marland Kitchen buPROPion (WELLBUTRIN XL) 150 MG 24 hr tablet Take 1 tablet by mouth daily.    . cilostazol (PLETAL) 100 MG tablet Take 1 tablet (100 mg total) by mouth 2 (two) times daily. 180 tablet 1  . dicyclomine (BENTYL) 10 MG capsule Take 1 capsule by mouth 2 (two) times daily.    Marland Kitchen donepezil (ARICEPT) 10 MG tablet Take 1 tablet by mouth daily.    . furosemide (LASIX) 20 MG tablet Take 1 tab (20 mg) once a week 15 tablet 5  . levothyroxine (SYNTHROID, LEVOTHROID) 25 MCG tablet Take 1 tablet by mouth daily.    Marland Kitchen lisinopril (PRINIVIL,ZESTRIL) 5 MG tablet  Take 1 tablet by mouth daily.    . montelukast (SINGULAIR) 10 MG tablet Take 10 mg by mouth at bedtime.    . potassium chloride SA (KLOR-CON) 20 MEQ tablet Take 1 tablet (20 mEq total) by mouth as needed. 15 tablet 5  . Vitamin D, Ergocalciferol, (DRISDOL) 50000 units CAPS capsule Take 50,000 Units by mouth every 7 (seven) days.    Marland Kitchen warfarin (COUMADIN) 5 MG tablet Take 1 tablet by mouth. Take 7.5mg  daily except for 5mg  every Thursday.     No current facility-administered medications on file prior to visit.    Allergies  Allergen Reactions  . Ticagrelor Hives  . Codeine     Objective:  General: Alert and oriented x3 in no acute distress  Dermatology: Laceration/ulceration with a fibrotic base that measures less than 0.5 cm circular in nature at the dorsal lateral foot, there is decreased edema, decreased erythema, decreased ecchymosis.  Nails are within normal limits.  Vascular: Dorsalis Pedis and Posterior Tibial pedal pulses palpable, Capillary Fill Time 5 seconds,(+) pedal hair growth bilateral, temperature gradient normal  Neurology: Gross sensation intact via light touch bilateral, patient is hypersensitive with touch to the left foot due to pain but denies any numbness or tingling to toes.  Musculoskeletal: Mild tenderness with palpation to the left foot at dorsal wound and very minimal pain to lesser toes like previous.  No pain to palpation to calf.     Assessment and Plan:  Problem List Items Addressed This Visit    None    Visit Diagnoses    Injury of toe on left foot, subsequent encounter    -  Primary   Ulcer of left foot, limited to breakdown of skin (HCC)       Closed nondisplaced fracture of phalanx of lesser toe of left foot with routine healing, unspecified phalanx, subsequent encounter       Edema of left foot       Left foot pain           -Complete examination performed -Discussed continued care for laceration/ulcer to left foot and toe fractures that is  improving -Cleansed with saline and applied Medihoney and dry dressing and advised patient to do the same like previous and to avoid Band-Aids that could be irritating to her skin -May slowly transition from surgical to to good supportive tennis shoe -Recommend continue with rest ice elevation daily until symptoms improve -Patient to return to office in 4 weeks for final wound check and x-rays  Landis Martins, DPM

## 2020-02-19 DIAGNOSIS — R791 Abnormal coagulation profile: Secondary | ICD-10-CM | POA: Diagnosis not present

## 2020-03-04 DIAGNOSIS — R791 Abnormal coagulation profile: Secondary | ICD-10-CM | POA: Diagnosis not present

## 2020-03-12 ENCOUNTER — Ambulatory Visit: Payer: PPO | Admitting: Sports Medicine

## 2020-03-12 ENCOUNTER — Ambulatory Visit (INDEPENDENT_AMBULATORY_CARE_PROVIDER_SITE_OTHER): Payer: PPO

## 2020-03-12 ENCOUNTER — Other Ambulatory Visit: Payer: Self-pay

## 2020-03-12 ENCOUNTER — Encounter: Payer: Self-pay | Admitting: Sports Medicine

## 2020-03-12 DIAGNOSIS — S99922D Unspecified injury of left foot, subsequent encounter: Secondary | ICD-10-CM

## 2020-03-12 DIAGNOSIS — S92505D Nondisplaced unspecified fracture of left lesser toe(s), subsequent encounter for fracture with routine healing: Secondary | ICD-10-CM | POA: Diagnosis not present

## 2020-03-12 DIAGNOSIS — M79672 Pain in left foot: Secondary | ICD-10-CM

## 2020-03-12 DIAGNOSIS — R600 Localized edema: Secondary | ICD-10-CM | POA: Diagnosis not present

## 2020-03-12 DIAGNOSIS — L97521 Non-pressure chronic ulcer of other part of left foot limited to breakdown of skin: Secondary | ICD-10-CM

## 2020-03-12 NOTE — Progress Notes (Signed)
Subjective: Lisa Gilbert is a 74 y.o. female patient who returns office for follow-up evaluation of laceration/wound to dorsum of left foot and toe fractures.  Patient reports foot is doing good is better and has healed no longer dressing it reports that there is still some swelling in the evening but otherwise is doing good.  Patient denies any other pedal complaints at this time.  Patient Active Problem List   Diagnosis Date Noted  . Coronary artery disease involving native coronary artery of native heart without angina pectoris 12/04/2019  . Mixed hyperlipidemia 05/20/2019  . Essential hypertension 05/20/2019  . Anticoagulated on Coumadin 03/24/2019  . Bilateral carotid bruits 01/18/2018  . Ex-smoker 01/18/2018  . Atherosclerosis 01/14/2018  . Depression 01/14/2018  . Disease of thyroid gland 01/14/2018  . Incisional hernia 07/19/2016  . Abnormal INR 11/30/2015  . Asymptomatic bilateral carotid artery stenosis 09/30/2015  . Encounter for therapeutic drug level monitoring 05/06/2015  . Long term current use of anticoagulant therapy 05/06/2015  . Peripheral vascular disease ([place]) 05/06/2015  . GI bleed 03/16/2015    Current Outpatient Medications on File Prior to Visit  Medication Sig Dispense Refill  . alendronate (FOSAMAX) 70 MG tablet Take 1 tablet by mouth once a week.    Marland Kitchen aspirin EC 81 MG tablet Take 1 tablet by mouth daily.    Marland Kitchen atorvastatin (LIPITOR) 80 MG tablet Take 1 tablet by mouth daily.    Marland Kitchen buPROPion (WELLBUTRIN XL) 150 MG 24 hr tablet Take 1 tablet by mouth daily.    . cilostazol (PLETAL) 100 MG tablet Take 1 tablet (100 mg total) by mouth 2 (two) times daily. 180 tablet 1  . dicyclomine (BENTYL) 10 MG capsule Take 1 capsule by mouth 2 (two) times daily.    Marland Kitchen donepezil (ARICEPT) 10 MG tablet Take 1 tablet by mouth daily.    . furosemide (LASIX) 20 MG tablet Take 1 tab (20 mg) once a week 15 tablet 5  . levothyroxine (SYNTHROID, LEVOTHROID) 25 MCG tablet Take 1  tablet by mouth daily.    Marland Kitchen lisinopril (PRINIVIL,ZESTRIL) 5 MG tablet Take 1 tablet by mouth daily.    . montelukast (SINGULAIR) 10 MG tablet Take 10 mg by mouth at bedtime.    . potassium chloride SA (KLOR-CON) 20 MEQ tablet Take 1 tablet (20 mEq total) by mouth as needed. 15 tablet 5  . Vitamin D, Ergocalciferol, (DRISDOL) 50000 units CAPS capsule Take 50,000 Units by mouth every 7 (seven) days.    Marland Kitchen warfarin (COUMADIN) 5 MG tablet Take 1 tablet by mouth. Take 7.5mg  daily except for 5mg  every Thursday.     No current facility-administered medications on file prior to visit.    Allergies  Allergen Reactions  . Ticagrelor Hives  . Codeine     Objective:  General: Alert and oriented x3 in no acute distress  Dermatology: Laceration/ulceration is now healed over at the dorsal lateral foot on the left with a dry scab present, there is decreased edema, no erythema, no ecchymosis.  Nails are within normal limits.  Vascular: Dorsalis Pedis and Posterior Tibial pedal pulses palpable, Capillary Fill Time 5 seconds,(+) pedal hair growth bilateral, temperature gradient normal.  Neurology: Gross sensation intact via light touch bilateral.  Musculoskeletal: No reproducible tenderness palpation to the dorsal foot or toes like previous on the left.  No symptomatic pedal deformity noted.  X-rays no acute findings previous toe fracture.  Be well-healed.     Assessment and Plan: Problem List Items Addressed  This Visit    None    Visit Diagnoses    Injury of toe on left foot, subsequent encounter    -  Primary   Relevant Orders   DG Foot Complete Left (Completed)   Ulcer of left foot, limited to breakdown of skin (HCC)       Healed   Closed nondisplaced fracture of phalanx of lesser toe of left foot with routine healing, unspecified phalanx, subsequent encounter       Healed   Edema of left foot       Left foot pain           -Complete examination performed -X-rays reviewed -Previous  laceration/ulcer to the left foot and fractures have healed -Patient may continue with good supportive shoes -Advised patient that swelling may continue even several months since she had a very traumatic injury but encourage elevation of the end of the day or use of compression garments to control the swelling until it has completely resolved -Continue with rest ice and over-the-counter anti-inflammatories as needed -Patient to return to office if symptoms fail to continue to improve or sooner if problems or issues arise.  Landis Martins, DPM

## 2020-03-18 DIAGNOSIS — R791 Abnormal coagulation profile: Secondary | ICD-10-CM | POA: Diagnosis not present

## 2020-03-31 DIAGNOSIS — I2510 Atherosclerotic heart disease of native coronary artery without angina pectoris: Secondary | ICD-10-CM | POA: Diagnosis not present

## 2020-03-31 DIAGNOSIS — M810 Age-related osteoporosis without current pathological fracture: Secondary | ICD-10-CM | POA: Diagnosis not present

## 2020-03-31 DIAGNOSIS — E559 Vitamin D deficiency, unspecified: Secondary | ICD-10-CM | POA: Diagnosis not present

## 2020-03-31 DIAGNOSIS — I10 Essential (primary) hypertension: Secondary | ICD-10-CM | POA: Diagnosis not present

## 2020-03-31 DIAGNOSIS — M79671 Pain in right foot: Secondary | ICD-10-CM | POA: Diagnosis not present

## 2020-03-31 DIAGNOSIS — J309 Allergic rhinitis, unspecified: Secondary | ICD-10-CM | POA: Diagnosis not present

## 2020-03-31 DIAGNOSIS — E039 Hypothyroidism, unspecified: Secondary | ICD-10-CM | POA: Diagnosis not present

## 2020-03-31 DIAGNOSIS — K589 Irritable bowel syndrome without diarrhea: Secondary | ICD-10-CM | POA: Diagnosis not present

## 2020-03-31 DIAGNOSIS — R413 Other amnesia: Secondary | ICD-10-CM | POA: Diagnosis not present

## 2020-03-31 DIAGNOSIS — F32A Depression, unspecified: Secondary | ICD-10-CM | POA: Diagnosis not present

## 2020-03-31 DIAGNOSIS — I739 Peripheral vascular disease, unspecified: Secondary | ICD-10-CM | POA: Diagnosis not present

## 2020-03-31 DIAGNOSIS — E785 Hyperlipidemia, unspecified: Secondary | ICD-10-CM | POA: Diagnosis not present

## 2020-04-01 DIAGNOSIS — E559 Vitamin D deficiency, unspecified: Secondary | ICD-10-CM | POA: Diagnosis not present

## 2020-04-01 DIAGNOSIS — Z79899 Other long term (current) drug therapy: Secondary | ICD-10-CM | POA: Diagnosis not present

## 2020-04-01 DIAGNOSIS — Z23 Encounter for immunization: Secondary | ICD-10-CM | POA: Diagnosis not present

## 2020-04-01 DIAGNOSIS — R791 Abnormal coagulation profile: Secondary | ICD-10-CM | POA: Diagnosis not present

## 2020-04-01 DIAGNOSIS — E785 Hyperlipidemia, unspecified: Secondary | ICD-10-CM | POA: Diagnosis not present

## 2020-04-09 ENCOUNTER — Telehealth: Payer: Self-pay

## 2020-04-09 MED ORDER — POTASSIUM CHLORIDE CRYS ER 20 MEQ PO TBCR: 20.0000 meq | EXTENDED_RELEASE_TABLET | ORAL | 5 refills | Status: DC | PRN

## 2020-04-09 NOTE — Telephone Encounter (Signed)
Rx refill for Potassium CL 20MEQ  sent to pharmacy.

## 2020-04-23 DIAGNOSIS — E785 Hyperlipidemia, unspecified: Secondary | ICD-10-CM | POA: Diagnosis not present

## 2020-04-23 DIAGNOSIS — Z139 Encounter for screening, unspecified: Secondary | ICD-10-CM | POA: Diagnosis not present

## 2020-04-23 DIAGNOSIS — Z9181 History of falling: Secondary | ICD-10-CM | POA: Diagnosis not present

## 2020-04-23 DIAGNOSIS — Z1331 Encounter for screening for depression: Secondary | ICD-10-CM | POA: Diagnosis not present

## 2020-04-23 DIAGNOSIS — Z0000 Encounter for general adult medical examination without abnormal findings: Secondary | ICD-10-CM | POA: Diagnosis not present

## 2020-05-04 DIAGNOSIS — R791 Abnormal coagulation profile: Secondary | ICD-10-CM | POA: Diagnosis not present

## 2020-06-01 DIAGNOSIS — L820 Inflamed seborrheic keratosis: Secondary | ICD-10-CM | POA: Diagnosis not present

## 2020-06-01 DIAGNOSIS — L814 Other melanin hyperpigmentation: Secondary | ICD-10-CM | POA: Diagnosis not present

## 2020-06-01 DIAGNOSIS — L821 Other seborrheic keratosis: Secondary | ICD-10-CM | POA: Diagnosis not present

## 2020-06-15 DIAGNOSIS — R791 Abnormal coagulation profile: Secondary | ICD-10-CM | POA: Diagnosis not present

## 2020-07-09 ENCOUNTER — Other Ambulatory Visit: Payer: Self-pay

## 2020-07-09 MED ORDER — POTASSIUM CHLORIDE CRYS ER 20 MEQ PO TBCR: 20.0000 meq | EXTENDED_RELEASE_TABLET | ORAL | 5 refills | Status: DC | PRN

## 2020-07-09 NOTE — Telephone Encounter (Signed)
Rx for Potassium CL refill sent to pharmacy.

## 2020-07-15 DIAGNOSIS — R791 Abnormal coagulation profile: Secondary | ICD-10-CM | POA: Diagnosis not present

## 2020-07-22 DIAGNOSIS — R791 Abnormal coagulation profile: Secondary | ICD-10-CM | POA: Diagnosis not present

## 2020-07-29 DIAGNOSIS — Z7901 Long term (current) use of anticoagulants: Secondary | ICD-10-CM | POA: Diagnosis not present

## 2020-08-05 DIAGNOSIS — Z7901 Long term (current) use of anticoagulants: Secondary | ICD-10-CM | POA: Diagnosis not present

## 2020-08-12 DIAGNOSIS — R413 Other amnesia: Secondary | ICD-10-CM | POA: Diagnosis not present

## 2020-08-12 DIAGNOSIS — I739 Peripheral vascular disease, unspecified: Secondary | ICD-10-CM | POA: Diagnosis not present

## 2020-08-12 DIAGNOSIS — E559 Vitamin D deficiency, unspecified: Secondary | ICD-10-CM | POA: Diagnosis not present

## 2020-08-12 DIAGNOSIS — K589 Irritable bowel syndrome without diarrhea: Secondary | ICD-10-CM | POA: Diagnosis not present

## 2020-08-12 DIAGNOSIS — J309 Allergic rhinitis, unspecified: Secondary | ICD-10-CM | POA: Diagnosis not present

## 2020-08-12 DIAGNOSIS — E039 Hypothyroidism, unspecified: Secondary | ICD-10-CM | POA: Diagnosis not present

## 2020-08-12 DIAGNOSIS — Z7901 Long term (current) use of anticoagulants: Secondary | ICD-10-CM | POA: Diagnosis not present

## 2020-08-12 DIAGNOSIS — I2510 Atherosclerotic heart disease of native coronary artery without angina pectoris: Secondary | ICD-10-CM | POA: Diagnosis not present

## 2020-08-12 DIAGNOSIS — E785 Hyperlipidemia, unspecified: Secondary | ICD-10-CM | POA: Diagnosis not present

## 2020-08-12 DIAGNOSIS — Z6828 Body mass index (BMI) 28.0-28.9, adult: Secondary | ICD-10-CM | POA: Diagnosis not present

## 2020-08-12 DIAGNOSIS — I10 Essential (primary) hypertension: Secondary | ICD-10-CM | POA: Diagnosis not present

## 2020-08-12 DIAGNOSIS — M810 Age-related osteoporosis without current pathological fracture: Secondary | ICD-10-CM | POA: Diagnosis not present

## 2020-09-21 DIAGNOSIS — Z7901 Long term (current) use of anticoagulants: Secondary | ICD-10-CM | POA: Diagnosis not present

## 2020-09-23 ENCOUNTER — Other Ambulatory Visit: Payer: Self-pay

## 2020-09-23 MED ORDER — POTASSIUM CHLORIDE CRYS ER 20 MEQ PO TBCR
20.0000 meq | EXTENDED_RELEASE_TABLET | ORAL | 1 refills | Status: DC | PRN
Start: 2020-09-23 — End: 2020-12-08

## 2020-09-23 NOTE — Telephone Encounter (Signed)
Refill of Potassium Chloride 20 mEq sent to [facility].

## 2020-10-11 DIAGNOSIS — I739 Peripheral vascular disease, unspecified: Secondary | ICD-10-CM | POA: Diagnosis not present

## 2020-10-11 DIAGNOSIS — I10 Essential (primary) hypertension: Secondary | ICD-10-CM | POA: Diagnosis not present

## 2020-10-11 DIAGNOSIS — K589 Irritable bowel syndrome without diarrhea: Secondary | ICD-10-CM | POA: Diagnosis not present

## 2020-10-11 DIAGNOSIS — J309 Allergic rhinitis, unspecified: Secondary | ICD-10-CM | POA: Diagnosis not present

## 2020-10-11 DIAGNOSIS — M79671 Pain in right foot: Secondary | ICD-10-CM | POA: Diagnosis not present

## 2020-10-11 DIAGNOSIS — R413 Other amnesia: Secondary | ICD-10-CM | POA: Diagnosis not present

## 2020-10-11 DIAGNOSIS — E559 Vitamin D deficiency, unspecified: Secondary | ICD-10-CM | POA: Diagnosis not present

## 2020-10-11 DIAGNOSIS — Z79899 Other long term (current) drug therapy: Secondary | ICD-10-CM | POA: Diagnosis not present

## 2020-10-11 DIAGNOSIS — I2510 Atherosclerotic heart disease of native coronary artery without angina pectoris: Secondary | ICD-10-CM | POA: Diagnosis not present

## 2020-10-11 DIAGNOSIS — E785 Hyperlipidemia, unspecified: Secondary | ICD-10-CM | POA: Diagnosis not present

## 2020-10-11 DIAGNOSIS — E039 Hypothyroidism, unspecified: Secondary | ICD-10-CM | POA: Diagnosis not present

## 2020-10-11 DIAGNOSIS — M810 Age-related osteoporosis without current pathological fracture: Secondary | ICD-10-CM | POA: Diagnosis not present

## 2020-10-21 DIAGNOSIS — Z79899 Other long term (current) drug therapy: Secondary | ICD-10-CM | POA: Diagnosis not present

## 2020-10-21 DIAGNOSIS — E559 Vitamin D deficiency, unspecified: Secondary | ICD-10-CM | POA: Diagnosis not present

## 2020-10-21 DIAGNOSIS — Z7901 Long term (current) use of anticoagulants: Secondary | ICD-10-CM | POA: Diagnosis not present

## 2020-10-21 DIAGNOSIS — E785 Hyperlipidemia, unspecified: Secondary | ICD-10-CM | POA: Diagnosis not present

## 2020-11-23 DIAGNOSIS — Z7901 Long term (current) use of anticoagulants: Secondary | ICD-10-CM | POA: Diagnosis not present

## 2020-11-30 DIAGNOSIS — Z7901 Long term (current) use of anticoagulants: Secondary | ICD-10-CM | POA: Diagnosis not present

## 2020-12-08 ENCOUNTER — Other Ambulatory Visit: Payer: Self-pay

## 2020-12-08 MED ORDER — POTASSIUM CHLORIDE CRYS ER 20 MEQ PO TBCR: 20.0000 meq | EXTENDED_RELEASE_TABLET | ORAL | 0 refills | Status: DC | PRN

## 2020-12-08 NOTE — Telephone Encounter (Signed)
Refill of Potassium Chloride 20 mEq sent to [facility].

## 2020-12-09 DIAGNOSIS — Z7901 Long term (current) use of anticoagulants: Secondary | ICD-10-CM | POA: Diagnosis not present

## 2020-12-16 DIAGNOSIS — Z7901 Long term (current) use of anticoagulants: Secondary | ICD-10-CM | POA: Diagnosis not present

## 2020-12-21 DIAGNOSIS — C44629 Squamous cell carcinoma of skin of left upper limb, including shoulder: Secondary | ICD-10-CM | POA: Diagnosis not present

## 2020-12-21 DIAGNOSIS — L820 Inflamed seborrheic keratosis: Secondary | ICD-10-CM | POA: Diagnosis not present

## 2020-12-23 DIAGNOSIS — Z7901 Long term (current) use of anticoagulants: Secondary | ICD-10-CM | POA: Diagnosis not present

## 2021-01-07 ENCOUNTER — Other Ambulatory Visit: Payer: Self-pay

## 2021-01-07 MED ORDER — POTASSIUM CHLORIDE CRYS ER 20 MEQ PO TBCR: 20.0000 meq | EXTENDED_RELEASE_TABLET | ORAL | 3 refills | Status: DC | PRN

## 2021-01-07 MED ORDER — FUROSEMIDE 20 MG PO TABS: ORAL_TABLET | ORAL | 5 refills | Status: DC

## 2021-01-10 DIAGNOSIS — I10 Essential (primary) hypertension: Secondary | ICD-10-CM | POA: Diagnosis not present

## 2021-01-10 DIAGNOSIS — E559 Vitamin D deficiency, unspecified: Secondary | ICD-10-CM | POA: Diagnosis not present

## 2021-01-10 DIAGNOSIS — F331 Major depressive disorder, recurrent, moderate: Secondary | ICD-10-CM | POA: Diagnosis not present

## 2021-01-10 DIAGNOSIS — I2510 Atherosclerotic heart disease of native coronary artery without angina pectoris: Secondary | ICD-10-CM | POA: Diagnosis not present

## 2021-01-10 DIAGNOSIS — E039 Hypothyroidism, unspecified: Secondary | ICD-10-CM | POA: Diagnosis not present

## 2021-01-10 DIAGNOSIS — I739 Peripheral vascular disease, unspecified: Secondary | ICD-10-CM | POA: Diagnosis not present

## 2021-01-10 DIAGNOSIS — F32A Depression, unspecified: Secondary | ICD-10-CM | POA: Diagnosis not present

## 2021-01-10 DIAGNOSIS — M810 Age-related osteoporosis without current pathological fracture: Secondary | ICD-10-CM | POA: Diagnosis not present

## 2021-01-10 DIAGNOSIS — R413 Other amnesia: Secondary | ICD-10-CM | POA: Diagnosis not present

## 2021-01-10 DIAGNOSIS — J309 Allergic rhinitis, unspecified: Secondary | ICD-10-CM | POA: Diagnosis not present

## 2021-01-10 DIAGNOSIS — E785 Hyperlipidemia, unspecified: Secondary | ICD-10-CM | POA: Diagnosis not present

## 2021-01-10 DIAGNOSIS — K589 Irritable bowel syndrome without diarrhea: Secondary | ICD-10-CM | POA: Diagnosis not present

## 2021-01-25 DIAGNOSIS — Z7901 Long term (current) use of anticoagulants: Secondary | ICD-10-CM | POA: Diagnosis not present

## 2021-02-03 ENCOUNTER — Other Ambulatory Visit: Payer: Self-pay | Admitting: Cardiology

## 2021-02-24 DIAGNOSIS — Z23 Encounter for immunization: Secondary | ICD-10-CM | POA: Diagnosis not present

## 2021-02-24 DIAGNOSIS — Z7901 Long term (current) use of anticoagulants: Secondary | ICD-10-CM | POA: Diagnosis not present

## 2021-03-24 DIAGNOSIS — Z7901 Long term (current) use of anticoagulants: Secondary | ICD-10-CM | POA: Diagnosis not present

## 2021-04-22 DIAGNOSIS — Z7901 Long term (current) use of anticoagulants: Secondary | ICD-10-CM | POA: Diagnosis not present

## 2021-04-25 ENCOUNTER — Other Ambulatory Visit: Payer: Self-pay | Admitting: *Deleted

## 2021-04-25 MED ORDER — POTASSIUM CHLORIDE CRYS ER 20 MEQ PO TBCR: 20.0000 meq | EXTENDED_RELEASE_TABLET | ORAL | 0 refills | Status: DC | PRN

## 2021-04-25 NOTE — Telephone Encounter (Signed)
Rx refill sent to pharmacy.  2nd attempt

## 2021-04-26 DIAGNOSIS — E785 Hyperlipidemia, unspecified: Secondary | ICD-10-CM | POA: Diagnosis not present

## 2021-04-26 DIAGNOSIS — S59911A Unspecified injury of right forearm, initial encounter: Secondary | ICD-10-CM | POA: Diagnosis not present

## 2021-04-26 DIAGNOSIS — Z0000 Encounter for general adult medical examination without abnormal findings: Secondary | ICD-10-CM | POA: Diagnosis not present

## 2021-04-26 DIAGNOSIS — Z1331 Encounter for screening for depression: Secondary | ICD-10-CM | POA: Diagnosis not present

## 2021-04-26 DIAGNOSIS — Z9181 History of falling: Secondary | ICD-10-CM | POA: Diagnosis not present

## 2021-04-26 DIAGNOSIS — M779 Enthesopathy, unspecified: Secondary | ICD-10-CM | POA: Diagnosis not present

## 2021-05-24 DIAGNOSIS — Z7901 Long term (current) use of anticoagulants: Secondary | ICD-10-CM | POA: Diagnosis not present

## 2021-06-23 DIAGNOSIS — Z7901 Long term (current) use of anticoagulants: Secondary | ICD-10-CM | POA: Diagnosis not present

## 2021-06-28 DIAGNOSIS — C44629 Squamous cell carcinoma of skin of left upper limb, including shoulder: Secondary | ICD-10-CM | POA: Diagnosis not present

## 2021-07-21 DIAGNOSIS — Z7901 Long term (current) use of anticoagulants: Secondary | ICD-10-CM | POA: Diagnosis not present

## 2021-07-28 DIAGNOSIS — Z7901 Long term (current) use of anticoagulants: Secondary | ICD-10-CM | POA: Diagnosis not present

## 2021-07-29 DIAGNOSIS — Z1231 Encounter for screening mammogram for malignant neoplasm of breast: Secondary | ICD-10-CM | POA: Diagnosis not present

## 2021-07-29 DIAGNOSIS — N959 Unspecified menopausal and perimenopausal disorder: Secondary | ICD-10-CM | POA: Diagnosis not present

## 2021-07-29 DIAGNOSIS — M810 Age-related osteoporosis without current pathological fracture: Secondary | ICD-10-CM | POA: Diagnosis not present

## 2021-08-04 DIAGNOSIS — Z7901 Long term (current) use of anticoagulants: Secondary | ICD-10-CM | POA: Diagnosis not present

## 2021-08-11 DIAGNOSIS — Z7901 Long term (current) use of anticoagulants: Secondary | ICD-10-CM | POA: Diagnosis not present

## 2021-08-18 DIAGNOSIS — Z7901 Long term (current) use of anticoagulants: Secondary | ICD-10-CM | POA: Diagnosis not present

## 2021-08-25 DIAGNOSIS — Z7901 Long term (current) use of anticoagulants: Secondary | ICD-10-CM | POA: Diagnosis not present

## 2021-09-01 DIAGNOSIS — E785 Hyperlipidemia, unspecified: Secondary | ICD-10-CM | POA: Diagnosis not present

## 2021-09-01 DIAGNOSIS — K589 Irritable bowel syndrome without diarrhea: Secondary | ICD-10-CM | POA: Diagnosis not present

## 2021-09-01 DIAGNOSIS — J309 Allergic rhinitis, unspecified: Secondary | ICD-10-CM | POA: Diagnosis not present

## 2021-09-01 DIAGNOSIS — R413 Other amnesia: Secondary | ICD-10-CM | POA: Diagnosis not present

## 2021-09-01 DIAGNOSIS — E039 Hypothyroidism, unspecified: Secondary | ICD-10-CM | POA: Diagnosis not present

## 2021-09-01 DIAGNOSIS — I739 Peripheral vascular disease, unspecified: Secondary | ICD-10-CM | POA: Diagnosis not present

## 2021-09-01 DIAGNOSIS — Z7901 Long term (current) use of anticoagulants: Secondary | ICD-10-CM | POA: Diagnosis not present

## 2021-09-01 DIAGNOSIS — E559 Vitamin D deficiency, unspecified: Secondary | ICD-10-CM | POA: Diagnosis not present

## 2021-09-01 DIAGNOSIS — F32A Depression, unspecified: Secondary | ICD-10-CM | POA: Diagnosis not present

## 2021-09-01 DIAGNOSIS — M810 Age-related osteoporosis without current pathological fracture: Secondary | ICD-10-CM | POA: Diagnosis not present

## 2021-09-01 DIAGNOSIS — I2510 Atherosclerotic heart disease of native coronary artery without angina pectoris: Secondary | ICD-10-CM | POA: Diagnosis not present

## 2021-09-01 DIAGNOSIS — Z79899 Other long term (current) drug therapy: Secondary | ICD-10-CM | POA: Diagnosis not present

## 2021-09-01 DIAGNOSIS — I10 Essential (primary) hypertension: Secondary | ICD-10-CM | POA: Diagnosis not present

## 2021-09-06 ENCOUNTER — Encounter: Payer: Self-pay | Admitting: Cardiology

## 2021-09-06 ENCOUNTER — Encounter: Payer: Self-pay | Admitting: *Deleted

## 2021-09-06 ENCOUNTER — Other Ambulatory Visit: Payer: Self-pay

## 2021-09-06 DIAGNOSIS — E663 Overweight: Secondary | ICD-10-CM | POA: Diagnosis not present

## 2021-09-06 DIAGNOSIS — I25119 Atherosclerotic heart disease of native coronary artery with unspecified angina pectoris: Secondary | ICD-10-CM | POA: Diagnosis not present

## 2021-09-06 DIAGNOSIS — F3341 Major depressive disorder, recurrent, in partial remission: Secondary | ICD-10-CM | POA: Diagnosis not present

## 2021-09-06 DIAGNOSIS — J309 Allergic rhinitis, unspecified: Secondary | ICD-10-CM | POA: Diagnosis not present

## 2021-09-06 DIAGNOSIS — J45909 Unspecified asthma, uncomplicated: Secondary | ICD-10-CM | POA: Diagnosis not present

## 2021-09-06 DIAGNOSIS — E785 Hyperlipidemia, unspecified: Secondary | ICD-10-CM | POA: Diagnosis not present

## 2021-09-06 DIAGNOSIS — I10 Essential (primary) hypertension: Secondary | ICD-10-CM | POA: Diagnosis not present

## 2021-09-06 DIAGNOSIS — G8929 Other chronic pain: Secondary | ICD-10-CM | POA: Diagnosis not present

## 2021-09-06 DIAGNOSIS — I2510 Atherosclerotic heart disease of native coronary artery without angina pectoris: Secondary | ICD-10-CM | POA: Diagnosis not present

## 2021-09-06 DIAGNOSIS — K219 Gastro-esophageal reflux disease without esophagitis: Secondary | ICD-10-CM | POA: Diagnosis not present

## 2021-09-06 DIAGNOSIS — F0390 Unspecified dementia without behavioral disturbance: Secondary | ICD-10-CM | POA: Diagnosis not present

## 2021-09-06 DIAGNOSIS — E039 Hypothyroidism, unspecified: Secondary | ICD-10-CM | POA: Diagnosis not present

## 2021-09-06 MED ORDER — POTASSIUM CHLORIDE CRYS ER 20 MEQ PO TBCR: 20.0000 meq | EXTENDED_RELEASE_TABLET | ORAL | 0 refills | Status: DC | PRN

## 2021-09-08 DIAGNOSIS — I2510 Atherosclerotic heart disease of native coronary artery without angina pectoris: Secondary | ICD-10-CM | POA: Diagnosis not present

## 2021-09-15 DIAGNOSIS — Z7901 Long term (current) use of anticoagulants: Secondary | ICD-10-CM | POA: Diagnosis not present

## 2021-09-22 DIAGNOSIS — Z7901 Long term (current) use of anticoagulants: Secondary | ICD-10-CM | POA: Diagnosis not present

## 2021-09-29 DIAGNOSIS — M810 Age-related osteoporosis without current pathological fracture: Secondary | ICD-10-CM | POA: Insufficient documentation

## 2021-09-29 DIAGNOSIS — Z9889 Other specified postprocedural states: Secondary | ICD-10-CM | POA: Insufficient documentation

## 2021-09-29 DIAGNOSIS — E039 Hypothyroidism, unspecified: Secondary | ICD-10-CM | POA: Insufficient documentation

## 2021-09-29 DIAGNOSIS — E559 Vitamin D deficiency, unspecified: Secondary | ICD-10-CM | POA: Insufficient documentation

## 2021-09-29 DIAGNOSIS — J309 Allergic rhinitis, unspecified: Secondary | ICD-10-CM | POA: Insufficient documentation

## 2021-09-29 DIAGNOSIS — E785 Hyperlipidemia, unspecified: Secondary | ICD-10-CM | POA: Insufficient documentation

## 2021-09-29 DIAGNOSIS — R413 Other amnesia: Secondary | ICD-10-CM | POA: Insufficient documentation

## 2021-09-29 DIAGNOSIS — E663 Overweight: Secondary | ICD-10-CM | POA: Insufficient documentation

## 2021-09-29 DIAGNOSIS — Z7901 Long term (current) use of anticoagulants: Secondary | ICD-10-CM | POA: Diagnosis not present

## 2021-09-29 DIAGNOSIS — K529 Noninfective gastroenteritis and colitis, unspecified: Secondary | ICD-10-CM | POA: Insufficient documentation

## 2021-09-29 DIAGNOSIS — K589 Irritable bowel syndrome without diarrhea: Secondary | ICD-10-CM | POA: Insufficient documentation

## 2021-09-29 DIAGNOSIS — J45909 Unspecified asthma, uncomplicated: Secondary | ICD-10-CM | POA: Insufficient documentation

## 2021-10-06 DIAGNOSIS — Z7901 Long term (current) use of anticoagulants: Secondary | ICD-10-CM | POA: Diagnosis not present

## 2021-10-14 DIAGNOSIS — Z7901 Long term (current) use of anticoagulants: Secondary | ICD-10-CM | POA: Diagnosis not present

## 2021-10-27 ENCOUNTER — Other Ambulatory Visit: Payer: Self-pay

## 2021-10-27 DIAGNOSIS — Z7901 Long term (current) use of anticoagulants: Secondary | ICD-10-CM | POA: Diagnosis not present

## 2021-11-01 ENCOUNTER — Encounter: Payer: Self-pay | Admitting: Cardiology

## 2021-11-01 ENCOUNTER — Ambulatory Visit: Payer: PPO | Admitting: Cardiology

## 2021-11-01 VITALS — BP 122/56 | HR 63 | Ht 60.0 in | Wt 136.4 lb

## 2021-11-01 DIAGNOSIS — I2510 Atherosclerotic heart disease of native coronary artery without angina pectoris: Secondary | ICD-10-CM | POA: Diagnosis not present

## 2021-11-01 DIAGNOSIS — I10 Essential (primary) hypertension: Secondary | ICD-10-CM | POA: Diagnosis not present

## 2021-11-01 DIAGNOSIS — I739 Peripheral vascular disease, unspecified: Secondary | ICD-10-CM | POA: Diagnosis not present

## 2021-11-01 DIAGNOSIS — Z7901 Long term (current) use of anticoagulants: Secondary | ICD-10-CM

## 2021-11-01 DIAGNOSIS — E782 Mixed hyperlipidemia: Secondary | ICD-10-CM | POA: Diagnosis not present

## 2021-11-01 DIAGNOSIS — F17210 Nicotine dependence, cigarettes, uncomplicated: Secondary | ICD-10-CM

## 2021-11-01 HISTORY — DX: Nicotine dependence, cigarettes, uncomplicated: F17.210

## 2021-11-01 NOTE — Patient Instructions (Signed)
Medication Instructions:  °Your physician recommends that you continue on your current medications as directed. Please refer to the Current Medication list given to you today. ° °*If you need a refill on your cardiac medications before your next appointment, please call your pharmacy* ° ° °Lab Work: °None ordered °If you have labs (blood work) drawn today and your tests are completely normal, you will receive your results only by: °MyChart Message (if you have MyChart) OR °A paper copy in the mail °If you have any lab test that is abnormal or we need to change your treatment, we will call you to review the results. ° ° °Testing/Procedures: °Your physician has requested that you have an echocardiogram. Echocardiography is a painless test that uses sound waves to create images of your heart. It provides your doctor with information about the size and shape of your heart and how well your heart’s chambers and valves are working. This procedure takes approximately one hour. There are no restrictions for this procedure. ° ° ° °Follow-Up: °At [facility], you and your health needs are our priority.  As part of our continuing mission to provide you with exceptional heart care, we have created designated Provider Care Teams.  These Care Teams include your primary Cardiologist (physician) and Advanced Practice Providers (APPs -  Physician Assistants and Nurse Practitioners) who all work together to provide you with the care you need, when you need it. ° °We recommend signing up for the patient portal called "MyChart".  Sign up information is provided on this After Visit Summary.  MyChart is used to connect with patients for Virtual Visits (Telemedicine).  Patients are able to view lab/test results, encounter notes, upcoming appointments, etc.  Non-urgent messages can be sent to your provider as well.   °To learn more about what you can do with MyChart, go to [email or web address].   ° °Your next appointment:   °6  month(s) ° °The format for your next appointment:   °In Person ° °Provider:   °Rajan Revankar, MD ° ° °Other Instructions °Echocardiogram °An echocardiogram is a test that uses sound waves (ultrasound) to produce images of the heart. °Images from an echocardiogram can provide important information about: °Heart size and shape. °The size and thickness and movement of your heart's walls. °Heart muscle function and strength. °Heart valve function or if you have stenosis. Stenosis is when the heart valves are too narrow. °If blood is flowing backward through the heart valves (regurgitation). °A tumor or infectious growth around the heart valves. °Areas of heart muscle that are not working well because of poor blood flow or injury from a heart attack. °Aneurysm detection. An aneurysm is a weak or damaged part of an artery wall. The wall bulges out from the normal force of blood pumping through the body. °Tell a health care provider about: °Any allergies you have. °All medicines you are taking, including vitamins, herbs, eye drops, creams, and over-the-counter medicines. °Any blood disorders you have. °Any surgeries you have had. °Any medical conditions you have. °Whether you are pregnant or may be pregnant. °What are the risks? °Generally, this is a safe test. However, problems may occur, including an allergic reaction to dye (contrast) that may be used during the test. °What happens before the test? °No specific preparation is needed. You may eat and drink normally. °What happens during the test? °You will take off your clothes from the waist up and put on a hospital gown. °Electrodes or electrocardiogram (ECG)patches may be placed on   your chest. The electrodes or patches are then connected to a device that monitors your heart rate and rhythm. °You will lie down on a table for an ultrasound exam. A gel will be applied to your chest to help sound waves pass through your skin. °A handheld device, called a transducer, will  be pressed against your chest and moved over your heart. The transducer produces sound waves that travel to your heart and bounce back (or "echo" back) to the transducer. These sound waves will be captured in real-time and changed into images of your heart that can be viewed on a video monitor. The images will be recorded on a computer and reviewed by your health care provider. °You may be asked to change positions or hold your breath for a short time. This makes it easier to get different views or better views of your heart. °In some cases, you may receive contrast through an IV in one of your veins. This can improve the quality of the pictures from your heart. °The procedure may vary among health care providers and hospitals.   °What can I expect after the test? °You may return to your normal, everyday life, including diet, activities, and medicines, unless your health care provider tells you not to do that. °Follow these instructions at home: °It is up to you to get the results of your test. Ask your health care provider, or the department that is doing the test, when your results will be ready. °Keep all follow-up visits. This is important. °Summary °An echocardiogram is a test that uses sound waves (ultrasound) to produce images of the heart. °Images from an echocardiogram can provide important information about the size and shape of your heart, heart muscle function, heart valve function, and other possible heart problems. °You do not need to do anything to prepare before this test. You may eat and drink normally. °After the echocardiogram is completed, you may return to your normal, everyday life, unless your health care provider tells you not to do that. °This information is not intended to replace advice given to you by your health care provider. Make sure you discuss any questions you have with your health care provider. °Document Revised: 12/30/2019 Document Reviewed: 12/30/2019 °Elsevier Patient  Education © 2021 [employer] ° ° °

## 2021-11-01 NOTE — Progress Notes (Signed)
Cardiology Office Note:    Date:  11/01/2021   ID:  Lisa Gilbert, DOB Mar 01, 1946, MRN [id number]  PCP:  Nicholos Johns, MD  Cardiologist:  Jenean Lindau, MD   Referring MD: Nicholos Johns, MD    ASSESSMENT:    1. Coronary artery disease involving native coronary artery of native heart without angina pectoris   2. Essential hypertension   3. Mixed hyperlipidemia   4. Peripheral vascular disease ([place])   5. Anticoagulated on Coumadin   6. Cigarette smoker    PLAN:    In order of problems listed above:  Coronary artery disease: Secondary prevention stressed with the patient.  Importance of compliance with diet and medication stressed and she vocalized understanding.  I made sure that she does carry nitroglycerin with her at all times and knows its use. Peripheral arterial disease: She wants to be evaluated by her peripheral vascular specialist and we will give her an appointment for this.  She is interested in knowing about her options for anticoagulation. Essential hypertension: Blood pressure stable and diet was emphasized. Mixed dyslipidemia: Lipids were reviewed and she is doing well with statin therapy.  Diet emphasized.  She was advised to walk on a regular basis. Cigarette smoker: I spent 5 minutes with the patient discussing solely about smoking. Smoking cessation was counseled. I suggested to the patient also different medications and pharmacological interventions. Patient is keen to try stopping on its own at this time. He will get back to me if he needs any further assistance in this matter. Patient will be seen in follow-up appointment in 6 months or earlier if the patient has any concerns    Medication Adjustments/Labs and Tests Ordered: Current medicines are reviewed at length with the patient today.  Concerns regarding medicines are outlined above.  No orders of the defined types were placed in this encounter.  No orders of the defined types were placed in this  encounter.    No chief complaint on file.    History of Present Illness:    Lisa Gilbert is a 76 y.o. female.  Patient has past medical history of coronary artery disease post stenting in 2015, essential hypertension, dyslipidemia and peripheral vascular disease with carotid endarterectomy and peripheral vascular surgery.  She denies any problems at this time and takes care of activities of daily living.  No chest pain orthopnea or PND.  At the time of my evaluation, the patient is alert awake oriented and in no distress.  She tells me that she walks on a regular basis.  She has no claudication issues.  She is on anticoagulation by peripheral vascular doctor in the past.  She is wondering whether she can transition it to the newer anticoagulants if she can afford that.  At the time of my evaluation, the patient is alert awake oriented and in no distress.  Unfortunately she continues to smoke.  Past Medical History:  Diagnosis Date   Abnormal INR 11/30/2015   Allergic rhinosinusitis    Anticoagulated on Coumadin 03/24/2019   Asymptomatic bilateral carotid artery stenosis 09/30/2015   Atherosclerosis 01/14/2018   Bilateral carotid bruits 01/18/2018   Bronchial asthma    Chronic diarrhea    Coronary artery disease involving native coronary artery of native heart without angina pectoris 12/04/2019   Depression 01/14/2018   Disease of thyroid gland 01/14/2018   hypothyroidism   Encounter for therapeutic drug level monitoring 05/06/2015   Essential hypertension 05/20/2019   Ex-smoker 01/18/2018   GI  bleed 03/16/2015   History of right-sided carotid endarterectomy    Hyperlipidemia    Hypothyroidism    IBS (irritable bowel syndrome)    Incisional hernia 07/19/2016   Long term current use of anticoagulant therapy 05/06/2015   Mild memory disturbance    Mixed hyperlipidemia 05/20/2019   Osteoporosis    Overweight    Peripheral vascular disease ([place]) 05/06/2015   Vitamin D deficiency      Past Surgical History:  Procedure Laterality Date   CAROTID STENT     CORONARY ANGIOPLASTY WITH STENT PLACEMENT  2015   2 Stents   HERNIA REPAIR  03/31/2019   Dr. Orrin Brigham III [facility] at [facility]    Current Medications: Current Meds  Medication Sig   albuterol (VENTOLIN HFA) 108 (90 Base) MCG/ACT inhaler Inhale 2 puffs into the lungs daily.   alendronate (FOSAMAX) 70 MG tablet Take 1 tablet by mouth once a week.   ALPRAZolam (XANAX) 0.25 MG tablet Take 0.25 mg by mouth daily as needed for anxiety.   aspirin EC 81 MG tablet Take 1 tablet by mouth daily.   atorvastatin (LIPITOR) 80 MG tablet Take 1 tablet by mouth daily.   buPROPion (WELLBUTRIN XL) 150 MG 24 hr tablet Take 150 mg by mouth daily.   cilostazol (PLETAL) 100 MG tablet Take 1 tablet (100 mg total) by mouth 2 (two) times daily.   donepezil (ARICEPT) 10 MG tablet Take 1 tablet by mouth daily.   fluticasone (FLONASE) 50 MCG/ACT nasal spray Place 2 sprays into both nostrils daily.   levothyroxine (SYNTHROID, LEVOTHROID) 25 MCG tablet Take 1 tablet by mouth daily.   lisinopril (PRINIVIL,ZESTRIL) 5 MG tablet Take 1 tablet by mouth daily.   loratadine (CLARITIN) 10 MG tablet Take 10 mg by mouth as needed for allergies or rhinitis.   montelukast (SINGULAIR) 10 MG tablet Take 10 mg by mouth at bedtime.   pantoprazole (PROTONIX) 40 MG tablet Take 40 mg by mouth daily.   sertraline (ZOLOFT) 50 MG tablet Take 50 mg by mouth daily.   Vitamin D, Ergocalciferol, (DRISDOL) 50000 units CAPS capsule Take 50,000 Units by mouth every 30 (thirty) days.   warfarin (COUMADIN) 5 MG tablet Take 5 mg by mouth daily. Except takes 7.'5mg'$  on Tuesday and Thursday     Allergies:   Ticagrelor and Codeine   Social History   Socioeconomic History   Marital status: Married    Spouse name: Not on file   Number of children: Not on file   Years of education: Not on file   Highest education level: Not on file  Occupational History   Not on file  Tobacco  Use   Smoking status: Former   Smokeless tobacco: Never  Vaping Use   Vaping Use: Never used  Substance and Sexual Activity   Alcohol use: Not on file   Drug use: Yes    Types: PCP   Sexual activity: Not on file  Other Topics Concern   Not on file  Social History Narrative   Not on file   Social Determinants of Health   Financial Resource Strain: Not on file  Food Insecurity: Not on file  Transportation Needs: Not on file  Physical Activity: Not on file  Stress: Not on file  Social Connections: Not on file     Family History: The patient's family history includes Breast cancer in her mother; Diabetes in her brother and sister; Heart Problems in her brother and sister; Hypertension in her mother; Kidney cancer  in her father.  ROS:   Please see the history of present illness.    All other systems reviewed and are negative.  EKGs/Labs/Other Studies Reviewed:    The following studies were reviewed today: EKG reveals sinus rhythm and nonspecific ST-T changes   Recent Labs: No results found for requested labs within last 365 days.  Recent Lipid Panel No results found for: "CHOL", "TRIG", "HDL", "CHOLHDL", "VLDL", "LDLCALC", "LDLDIRECT"  Physical Exam:    VS:  BP (!) 122/56   Pulse 63   Ht 5' (1.524 m)   Wt 136 lb 6.4 oz (61.9 kg)   SpO2 95%   BMI 26.64 kg/m     Wt Readings from Last 3 Encounters:  11/01/21 136 lb 6.4 oz (61.9 kg)  09/01/21 139 lb 3 oz (63.1 kg)  12/04/19 145 lb 6.4 oz (66 kg)     GEN: Patient is in no acute distress HEENT: Normal NECK: No JVD; No carotid bruits LYMPHATICS: No lymphadenopathy CARDIAC: Hear sounds regular, 2/6 systolic murmur at the apex. RESPIRATORY:  Clear to auscultation without rales, wheezing or rhonchi  ABDOMEN: Soft, non-tender, non-distended MUSCULOSKELETAL:  No edema; No deformity  SKIN: Warm and dry NEUROLOGIC:  Alert and oriented x 3 PSYCHIATRIC:  Normal affect   Signed, Jenean Lindau, MD  11/01/2021 2:49  PM    [place]

## 2021-11-02 ENCOUNTER — Ambulatory Visit (INDEPENDENT_AMBULATORY_CARE_PROVIDER_SITE_OTHER): Payer: PPO

## 2021-11-02 DIAGNOSIS — I2510 Atherosclerotic heart disease of native coronary artery without angina pectoris: Secondary | ICD-10-CM | POA: Diagnosis not present

## 2021-11-02 LAB — ECHOCARDIOGRAM COMPLETE
AR max vel: 1.48 cm2
AV Area VTI: 1.5 cm2
AV Area mean vel: 1.21 cm2
AV Mean grad: 14 mmHg
AV Peak grad: 23.4 mmHg
Ao pk vel: 2.42 m/s
Area-P 1/2: 2.66 cm2
S' Lateral: 2.3 cm

## 2021-11-02 MED ORDER — PERFLUTREN LIPID MICROSPHERE
1.0000 mL | INTRAVENOUS | Status: AC | PRN
  Administered 2021-11-02: 4 mL via INTRAVENOUS

## 2021-11-10 ENCOUNTER — Encounter: Payer: Self-pay | Admitting: Cardiovascular Disease

## 2021-11-10 ENCOUNTER — Ambulatory Visit: Payer: PPO | Admitting: Cardiovascular Disease

## 2021-11-10 DIAGNOSIS — I739 Peripheral vascular disease, unspecified: Secondary | ICD-10-CM

## 2021-11-10 NOTE — Patient Instructions (Addendum)
Medication Instructions:  Your physician recommends that you continue on your current medications as directed. Please refer to the Current Medication list given to you today.  *If you need a refill on your cardiac medications before your next appointment, please call your pharmacy*   Lab Work: None If you have labs (blood work) drawn today and your tests are completely normal, you will receive your results only by: [place] (if you have MyChart) OR A paper copy in the mail If you have any lab test that is abnormal or we need to change your treatment, we will call you to review the results.   Testing/Procedures: Your physician has requested that you have a lower extremity arterial duplex. This test is an ultrasound of the arteries in the legs. It looks at arterial blood flow in the legs. Allow one hour for Lower Arterial scans. There are no restrictions or special instructions    Follow-Up: At [facility], you and your health needs are our priority.  As part of our continuing mission to provide you with exceptional heart care, we have created designated Provider Care Teams.  These Care Teams include your primary Cardiologist (physician) and Advanced Practice Providers (APPs -  Physician Assistants and Nurse Practitioners) who all work together to provide you with the care you need, when you need it.  We recommend signing up for the patient portal called "MyChart".  Sign up information is provided on this After Visit Summary.  MyChart is used to connect with patients for Virtual Visits (Telemedicine).  Patients are able to view lab/test results, encounter notes, upcoming appointments, etc.  Non-urgent messages can be sent to your provider as well.   To learn more about what you can do with MyChart, go to [email or web address].    Your next appointment:    As needed unless ultrasound abnormal  The format for your next appointment:   In Person  Provider:   Quay Burow, MD     Other Instructions   Important Information About Sugar

## 2021-11-10 NOTE — Assessment & Plan Note (Signed)
Lisa Gilbert was referred to me by Dr. Geraldo Pitter for evaluation of PAD.  She apparently had bilateral lower extremity intervention by Dr. Maryjean Morn in [facility] 2015.  Apparently there was a complication requiring emergency surgical intervention.  She does complain of left calf claudication.  She has an absent left pedal pulse and a 1-2+ right pedal pulse.  I am going to get lower extremity arterial Doppler studies to further evaluate.

## 2021-11-10 NOTE — Progress Notes (Signed)
11/10/2021 [name]   [birth year]  [id number]  Primary Physician Lisa Johns, MD Primary Cardiologist: Lisa Harp MD Lisa Gilbert, [place]  HPI:  Lisa Gilbert is a 76 y.o. mildly overweight widowed Caucasian female mother of 2, grandmother of 3 grandchildren referred to me by Dr. Geraldo Gilbert, her cardiologist, for peripheral vasodilation.  She is retired from working at [employer] where she worked for 30 years.  Risk factors include over 50 pack years of tobacco abuse currently smoking 1/2 pack/day, treated hypertension and hyperlipidemia.  Apparently her brothers had ischemic heart disease as well.  She had a myocardial infarction in 2015 and had 2 stents placed in her heart at that time in [facility].  She is also had right internal carotid artery stenting.  As well has bilateral lower extremity stents by Dr. Maryjean Gilbert, vascular surgeon, apparently there was a complication during stent placement requiring emergency surgery.  She has been on Coumadin anticoagulation since for unclear reasons.  There is no history of PAF or DVT that I cannot determine.  She does complain of left calf claudication.   Current Meds  Medication Sig   albuterol (VENTOLIN HFA) 108 (90 Base) MCG/ACT inhaler Inhale 2 puffs into the lungs daily.   alendronate (FOSAMAX) 70 MG tablet Take 1 tablet by mouth once a week.   ALPRAZolam (XANAX) 0.25 MG tablet Take 0.25 mg by mouth daily as needed for anxiety.   aspirin EC 81 MG tablet Take 1 tablet by mouth daily.   atorvastatin (LIPITOR) 80 MG tablet Take 1 tablet by mouth daily.   buPROPion (WELLBUTRIN XL) 150 MG 24 hr tablet Take 150 mg by mouth daily.   cilostazol (PLETAL) 100 MG tablet Take 1 tablet (100 mg total) by mouth 2 (two) times daily.   donepezil (ARICEPT) 10 MG tablet Take 1 tablet by mouth daily.   fluticasone (FLONASE) 50 MCG/ACT nasal spray Place 2 sprays into both nostrils daily.   levothyroxine (SYNTHROID, LEVOTHROID) 25 MCG tablet  Take 1 tablet by mouth daily.   lisinopril (PRINIVIL,ZESTRIL) 5 MG tablet Take 1 tablet by mouth daily.   loratadine (CLARITIN) 10 MG tablet Take 10 mg by mouth as needed for allergies or rhinitis.   montelukast (SINGULAIR) 10 MG tablet Take 10 mg by mouth at bedtime.   pantoprazole (PROTONIX) 40 MG tablet Take 40 mg by mouth daily.   potassium chloride SA (KLOR-CON M) 20 MEQ tablet Take 1 tablet (20 mEq total) by mouth as needed. Patient must keep appointment for 11/01/21 for further refills. 3 rd/final attempt   sertraline (ZOLOFT) 50 MG tablet Take 50 mg by mouth daily.   Vitamin D, Ergocalciferol, (DRISDOL) 50000 units CAPS capsule Take 50,000 Units by mouth every 30 (thirty) days.   warfarin (COUMADIN) 5 MG tablet Take 5 mg by mouth daily. Except takes 7.'5mg'$  on Tuesday and Thursday     Allergies  Allergen Reactions   Ticagrelor Hives   Codeine     Social History   Socioeconomic History   Marital status: Married    Spouse name: Not on file   Number of children: Not on file   Years of education: Not on file   Highest education level: Not on file  Occupational History   Not on file  Tobacco Use   Smoking status: Former   Smokeless tobacco: Never  Vaping Use   Vaping Use: Never used  Substance and Sexual Activity   Alcohol use: Not on file  Drug use: Yes    Types: PCP   Sexual activity: Not on file  Other Topics Concern   Not on file  Social History Narrative   Not on file   Social Determinants of Health   Financial Resource Strain: Not on file  Food Insecurity: Not on file  Transportation Needs: Not on file  Physical Activity: Not on file  Stress: Not on file  Social Connections: Not on file  Intimate Partner Violence: Not on file     Review of Systems: General: negative for chills, fever, night sweats or weight changes.  Cardiovascular: negative for chest pain, dyspnea on exertion, edema, orthopnea, palpitations, paroxysmal nocturnal dyspnea or shortness of  breath Dermatological: negative for rash Respiratory: negative for cough or wheezing Urologic: negative for hematuria Abdominal: negative for nausea, vomiting, diarrhea, bright red blood per rectum, melena, or hematemesis Neurologic: negative for visual changes, syncope, or dizziness All other systems reviewed and are otherwise negative except as noted above.    Blood pressure (!) 100/52, pulse 66, height 5' (1.524 m), weight 136 lb (61.7 kg).  General appearance: alert and no distress Neck: no adenopathy, no JVD, supple, symmetrical, trachea midline, thyroid not enlarged, symmetric, no tenderness/mass/nodules, and bilateral carotid bruits Lungs: clear to auscultation bilaterally Heart: regular rate and rhythm, S1, S2 normal, no murmur, click, rub or gallop Extremities: extremities normal, atraumatic, no cyanosis or edema Pulses: Absent left pedal pulse Skin: Skin color, texture, turgor normal. No rashes or lesions Neurologic: Grossly normal  EKG not performed today  ASSESSMENT AND PLAN:   Peripheral vascular disease ([place]) Lisa Gilbert was referred to me by Dr. Geraldo Gilbert for evaluation of PAD.  She apparently had bilateral lower extremity intervention by Dr. Maryjean Gilbert in [facility] 2015.  Apparently there was a complication requiring emergency surgical intervention.  She does complain of left calf claudication.  She has an absent left pedal pulse and a 1-2+ right pedal pulse.  I am going to get lower extremity arterial Doppler studies to further evaluate.     Lisa Harp MD FACP,FACC,FAHA, [facility] 11/10/2021 9:01 AM

## 2021-11-15 DIAGNOSIS — E039 Hypothyroidism, unspecified: Secondary | ICD-10-CM | POA: Diagnosis not present

## 2021-11-15 DIAGNOSIS — E559 Vitamin D deficiency, unspecified: Secondary | ICD-10-CM | POA: Diagnosis not present

## 2021-11-15 DIAGNOSIS — R413 Other amnesia: Secondary | ICD-10-CM | POA: Diagnosis not present

## 2021-11-15 DIAGNOSIS — K589 Irritable bowel syndrome without diarrhea: Secondary | ICD-10-CM | POA: Diagnosis not present

## 2021-11-15 DIAGNOSIS — Z6826 Body mass index (BMI) 26.0-26.9, adult: Secondary | ICD-10-CM | POA: Diagnosis not present

## 2021-11-15 DIAGNOSIS — I10 Essential (primary) hypertension: Secondary | ICD-10-CM | POA: Diagnosis not present

## 2021-11-15 DIAGNOSIS — E785 Hyperlipidemia, unspecified: Secondary | ICD-10-CM | POA: Diagnosis not present

## 2021-11-15 DIAGNOSIS — I2510 Atherosclerotic heart disease of native coronary artery without angina pectoris: Secondary | ICD-10-CM | POA: Diagnosis not present

## 2021-11-15 DIAGNOSIS — J309 Allergic rhinitis, unspecified: Secondary | ICD-10-CM | POA: Diagnosis not present

## 2021-11-16 ENCOUNTER — Other Ambulatory Visit (HOSPITAL_COMMUNITY): Payer: Self-pay | Admitting: Cardiovascular Disease

## 2021-11-16 DIAGNOSIS — M79605 Pain in left leg: Secondary | ICD-10-CM

## 2021-11-29 ENCOUNTER — Ambulatory Visit (HOSPITAL_COMMUNITY)
Admission: RE | Admit: 2021-11-29 | Discharge: 2021-11-29 | Disposition: A | Discharge status: Home / Self Care | Payer: PPO | Source: Ambulatory Visit | Attending: Internal Medicine | Admitting: Internal Medicine

## 2021-11-29 DIAGNOSIS — M79605 Pain in left leg: Secondary | ICD-10-CM | POA: Insufficient documentation

## 2021-11-29 DIAGNOSIS — I739 Peripheral vascular disease, unspecified: Secondary | ICD-10-CM | POA: Diagnosis not present

## 2021-11-30 ENCOUNTER — Ambulatory Visit: Payer: PPO | Admitting: Cardiovascular Disease

## 2021-11-30 ENCOUNTER — Encounter: Payer: Self-pay | Admitting: Cardiovascular Disease

## 2021-11-30 DIAGNOSIS — I739 Peripheral vascular disease, unspecified: Secondary | ICD-10-CM

## 2021-11-30 NOTE — Patient Instructions (Signed)
Medication Instructions:  Your physician recommends that you continue on your current medications as directed. Please refer to the Current Medication list given to you today.  *If you need a refill on your cardiac medications before your next appointment, please call your pharmacy*   Lab Work: Your physician recommends that you have labs drawn today: BMET & CBC  If you have labs (blood work) drawn today and your tests are completely normal, you will receive your results only by: [place] (if you have MyChart) OR A paper copy in the mail If you have any lab test that is abnormal or we need to change your treatment, we will call you to review the results.   Testing/Procedures: Non-Cardiac CT Angiography (CTA), is a special type of CT scan that uses a computer to produce multi-dimensional views of major blood vessels throughout the body. In CT angiography, a contrast material is injected through an IV to help visualize the blood vessels. Abdomen/Pelvis CT with contrast   Follow-Up: At [facility], you and your health needs are our priority.  As part of our continuing mission to provide you with exceptional heart care, we have created designated Provider Care Teams.  These Care Teams include your primary Cardiologist (physician) and Advanced Practice Providers (APPs -  Physician Assistants and Nurse Practitioners) who all work together to provide you with the care you need, when you need it.  We recommend signing up for the patient portal called "MyChart".  Sign up information is provided on this After Visit Summary.  MyChart is used to connect with patients for Virtual Visits (Telemedicine).  Patients are able to view lab/test results, encounter notes, upcoming appointments, etc.  Non-urgent messages can be sent to your provider as well.   To learn more about what you can do with MyChart, go to [email or web address].    Your next appointment:   To be determined  Provider:    Quay Burow, MD

## 2021-11-30 NOTE — Assessment & Plan Note (Signed)
Ms. Lisa Gilbert returns for follow-up of her lower extremity Doppler studies performed in the evaluation of claudication.  These were performed 11/30/2021.  She has a history of bilateral iliac stenting by Dr. Maryjean Morn in an outpatient setting in his office.  Apparently there was a technical complication which sounds like a perforation.  She was transported urgently to [facility] hospital where she was intubated for a week in the ICU receiving blood and platelet transfusion.  She will ultimately then had surgery on her left groin by Dr. Maryjean Morn.  Ultimately, her claudication resolved until last several months when she is noticed left calf claudication.  Dopplers performed 11/29/2021 revealed a right ABI of 0.77 and a left of 0.58.  She had high-frequency signals in both iliac arteries and in the left common femoral artery.  She wishes to proceed with outpatient peripheral angiography and potential endovascular therapy for lifestyle-limiting claudication.

## 2021-11-30 NOTE — H&P (View-Only) (Signed)
11/30/2021 [name]   [birth year]  [id number]  Primary Physician Nicholos Johns, MD Primary Cardiologist: Lorretta Harp MD Lupe Carney, [place]  HPI:  Lisa Gilbert is a 76 y.o.  mildly overweight widowed Caucasian female mother of 2, grandmother of 3 grandchildren referred to me by Dr. Geraldo Pitter, her cardiologist, for peripheral vasodilation.  She is retired from working at [employer] where she worked for 30 years.  I last saw her in the office 11/10/2021.  She is accompanied by her daughter Amy today.  Risk factors include over 50 pack years of tobacco abuse currently smoking 1/2 pack/day, treated hypertension and hyperlipidemia.  Apparently her brothers had ischemic heart disease as well.  She had a myocardial infarction in 2015 and had 2 stents placed in her heart at that time in [facility]  She is also had right internal carotid artery stenting.  As well has bilateral lower extremity stents by Dr. Maryjean Morn, vascular surgeon, apparently there was a complication during stent placement requiring emergency surgery.  She has been on Coumadin anticoagulation since for unclear reasons.  There is no history of PAF or DVT that I can determine.  She does complain of left calf claudication.  Since I saw her in the office several weeks ago she did have lower extremity arterial Doppler studies performed 11/30/2021 revealing a right ABI of 0.77 and a left of 0.58.  She had high-frequency signals in both iliac arteries as well as in her left common femoral artery.  She did state that after her intervention which was done in the outpatient setting she had what sounds like a perforation in her iliac artery above the left iliac stent requiring urgent transfer to [facility] where she was placed in the ICU, intubated and transfused.  She also had surgery on her right groin as well.  Her claudication resolved after that however, over the last several months she developed left calf  claudication.   Current Meds  Medication Sig   albuterol (VENTOLIN HFA) 108 (90 Base) MCG/ACT inhaler Inhale 2 puffs into the lungs daily.   alendronate (FOSAMAX) 70 MG tablet Take 1 tablet by mouth once a week.   ALPRAZolam (XANAX) 0.25 MG tablet Take 0.25 mg by mouth daily as needed for anxiety.   aspirin EC 81 MG tablet Take 1 tablet by mouth daily.   atorvastatin (LIPITOR) 80 MG tablet Take 1 tablet by mouth daily.   buPROPion (WELLBUTRIN XL) 150 MG 24 hr tablet Take 150 mg by mouth daily.   cilostazol (PLETAL) 100 MG tablet Take 1 tablet (100 mg total) by mouth 2 (two) times daily.   donepezil (ARICEPT) 10 MG tablet Take 1 tablet by mouth daily.   fluticasone (FLONASE) 50 MCG/ACT nasal spray Place 2 sprays into both nostrils daily.   levothyroxine (SYNTHROID, LEVOTHROID) 25 MCG tablet Take 1 tablet by mouth daily.   lisinopril (PRINIVIL,ZESTRIL) 5 MG tablet Take 1 tablet by mouth daily.   loratadine (CLARITIN) 10 MG tablet Take 10 mg by mouth as needed for allergies or rhinitis.   montelukast (SINGULAIR) 10 MG tablet Take 10 mg by mouth at bedtime.   pantoprazole (PROTONIX) 40 MG tablet Take 40 mg by mouth daily.   potassium chloride SA (KLOR-CON M) 20 MEQ tablet Take 1 tablet (20 mEq total) by mouth as needed. Patient must keep appointment for 11/01/21 for further refills. 3 rd/final attempt   sertraline (ZOLOFT) 50 MG tablet Take 50 mg  by mouth daily.   Vitamin D, Ergocalciferol, (DRISDOL) 50000 units CAPS capsule Take 50,000 Units by mouth every 30 (thirty) days.   warfarin (COUMADIN) 5 MG tablet Take 5 mg by mouth daily. Except takes 7.'5mg'$  on Tuesday and Thursday     Allergies  Allergen Reactions   Ticagrelor Hives   Codeine     Social History   Socioeconomic History   Marital status: Married    Spouse name: Not on file   Number of children: Not on file   Years of education: Not on file   Highest education level: Not on file  Occupational History   Not on file   Tobacco Use   Smoking status: Former   Smokeless tobacco: Never  Vaping Use   Vaping Use: Never used  Substance and Sexual Activity   Alcohol use: Not on file   Drug use: Yes    Types: PCP   Sexual activity: Not on file  Other Topics Concern   Not on file  Social History Narrative   Not on file   Social Determinants of Health   Financial Resource Strain: Not on file  Food Insecurity: Not on file  Transportation Needs: Not on file  Physical Activity: Not on file  Stress: Not on file  Social Connections: Not on file  Intimate Partner Violence: Not on file     Review of Systems: General: negative for chills, fever, night sweats or weight changes.  Cardiovascular: negative for chest pain, dyspnea on exertion, edema, orthopnea, palpitations, paroxysmal nocturnal dyspnea or shortness of breath Dermatological: negative for rash Respiratory: negative for cough or wheezing Urologic: negative for hematuria Abdominal: negative for nausea, vomiting, diarrhea, bright red blood per rectum, melena, or hematemesis Neurologic: negative for visual changes, syncope, or dizziness All other systems reviewed and are otherwise negative except as noted above.    Blood pressure (!) 118/50, pulse 74, height 5' (1.524 m), weight 136 lb (61.7 kg).  General appearance: alert and no distress Neck: no adenopathy, no carotid bruit, no JVD, supple, symmetrical, trachea midline, and thyroid not enlarged, symmetric, no tenderness/mass/nodules Lungs: clear to auscultation bilaterally Heart: regular rate and rhythm, S1, S2 normal, no murmur, click, rub or gallop Extremities: extremities normal, atraumatic, no cyanosis or edema Pulses: Diminished pedal pulses, bilateral femoral bruits Skin: Skin color, texture, turgor normal. No rashes or lesions Neurologic: Grossly normal  EKG not performed today  ASSESSMENT AND PLAN:   Peripheral vascular disease ([place]) Ms. Frech returns for follow-up of her lower  extremity Doppler studies performed in the evaluation of claudication.  These were performed 11/30/2021.  She has a history of bilateral iliac stenting by Dr. Maryjean Morn in an outpatient setting in his office.  Apparently there was a technical complication which sounds like a perforation.  She was transported urgently to [facility] hospital where she was intubated for a week in the ICU receiving blood and platelet transfusion.  She will ultimately then had surgery on her left groin by Dr. Maryjean Morn.  Ultimately, her claudication resolved until last several months when she is noticed left calf claudication.  Dopplers performed 11/29/2021 revealed a right ABI of 0.77 and a left of 0.58.  She had high-frequency signals in both iliac arteries and in the left common femoral artery.  She wishes to proceed with outpatient peripheral angiography and potential endovascular therapy for lifestyle-limiting claudication.     Lorretta Harp MD FACP,FACC,FAHA, [facility] 11/30/2021 10:48 AM

## 2021-11-30 NOTE — Progress Notes (Signed)
11/30/2021 Lisa Gilbert   Jul 14, 1945  [id number]  Primary Physician Nicholos Johns, MD Primary Cardiologist: Lorretta Harp MD Lupe Carney, [place]  HPI:  Lisa Gilbert is a 76 y.o.  mildly overweight widowed Caucasian female mother of 2, grandmother of 3 grandchildren referred to me by Dr. Geraldo Pitter, her cardiologist, for peripheral vasodilation.  She is retired from working at [employer] where she worked for 30 years.  I last saw her in the office 11/10/2021.  She is accompanied by her daughter Amy today.  Risk factors include over 50 pack years of tobacco abuse currently smoking 1/2 pack/day, treated hypertension and hyperlipidemia.  Apparently her brothers had ischemic heart disease as well.  She had a myocardial infarction in 2015 and had 2 stents placed in her heart at that time in [facility].  She is also had right internal carotid artery stenting.  As well has bilateral lower extremity stents by Dr. Maryjean Morn, vascular surgeon, apparently there was a complication during stent placement requiring emergency surgery.  She has been on Coumadin anticoagulation since for unclear reasons.  There is no history of PAF or DVT that I can determine.  She does complain of left calf claudication.  Since I saw her in the office several weeks ago she did have lower extremity arterial Doppler studies performed 11/30/2021 revealing a right ABI of 0.77 and a left of 0.58.  She had high-frequency signals in both iliac arteries as well as in her left common femoral artery.  She did state that after her intervention which was done in the outpatient setting she had what sounds like a perforation in her iliac artery above the left iliac stent requiring urgent transfer to [facility] regional hospital where she was placed in the ICU, intubated and transfused.  She also had surgery on her right groin as well.  Her claudication resolved after that however, over the last several months she developed left calf  claudication.   Current Meds  Medication Sig   albuterol (VENTOLIN HFA) 108 (90 Base) MCG/ACT inhaler Inhale 2 puffs into the lungs daily.   alendronate (FOSAMAX) 70 MG tablet Take 1 tablet by mouth once a week.   ALPRAZolam (XANAX) 0.25 MG tablet Take 0.25 mg by mouth daily as needed for anxiety.   aspirin EC 81 MG tablet Take 1 tablet by mouth daily.   atorvastatin (LIPITOR) 80 MG tablet Take 1 tablet by mouth daily.   buPROPion (WELLBUTRIN XL) 150 MG 24 hr tablet Take 150 mg by mouth daily.   cilostazol (PLETAL) 100 MG tablet Take 1 tablet (100 mg total) by mouth 2 (two) times daily.   donepezil (ARICEPT) 10 MG tablet Take 1 tablet by mouth daily.   fluticasone (FLONASE) 50 MCG/ACT nasal spray Place 2 sprays into both nostrils daily.   levothyroxine (SYNTHROID, LEVOTHROID) 25 MCG tablet Take 1 tablet by mouth daily.   lisinopril (PRINIVIL,ZESTRIL) 5 MG tablet Take 1 tablet by mouth daily.   loratadine (CLARITIN) 10 MG tablet Take 10 mg by mouth as needed for allergies or rhinitis.   montelukast (SINGULAIR) 10 MG tablet Take 10 mg by mouth at bedtime.   pantoprazole (PROTONIX) 40 MG tablet Take 40 mg by mouth daily.   potassium chloride SA (KLOR-CON M) 20 MEQ tablet Take 1 tablet (20 mEq total) by mouth as needed. Patient must keep appointment for 11/01/21 for further refills. 3 rd/final attempt   sertraline (ZOLOFT) 50 MG tablet Take 50 mg  by mouth daily.   Vitamin D, Ergocalciferol, (DRISDOL) 50000 units CAPS capsule Take 50,000 Units by mouth every 30 (thirty) days.   warfarin (COUMADIN) 5 MG tablet Take 5 mg by mouth daily. Except takes 7.'5mg'$  on Tuesday and Thursday     Allergies  Allergen Reactions   Ticagrelor Hives   Codeine     Social History   Socioeconomic History   Marital status: Married    Spouse name: Not on file   Number of children: Not on file   Years of education: Not on file   Highest education level: Not on file  Occupational History   Not on file   Tobacco Use   Smoking status: Former   Smokeless tobacco: Never  Vaping Use   Vaping Use: Never used  Substance and Sexual Activity   Alcohol use: Not on file   Drug use: Yes    Types: PCP   Sexual activity: Not on file  Other Topics Concern   Not on file  Social History Narrative   Not on file   Social Determinants of Health   Financial Resource Strain: Not on file  Food Insecurity: Not on file  Transportation Needs: Not on file  Physical Activity: Not on file  Stress: Not on file  Social Connections: Not on file  Intimate Partner Violence: Not on file     Review of Systems: General: negative for chills, fever, night sweats or weight changes.  Cardiovascular: negative for chest pain, dyspnea on exertion, edema, orthopnea, palpitations, paroxysmal nocturnal dyspnea or shortness of breath Dermatological: negative for rash Respiratory: negative for cough or wheezing Urologic: negative for hematuria Abdominal: negative for nausea, vomiting, diarrhea, bright red blood per rectum, melena, or hematemesis Neurologic: negative for visual changes, syncope, or dizziness All other systems reviewed and are otherwise negative except as noted above.    Blood pressure (!) 118/50, pulse 74, height 5' (1.524 m), weight 136 lb (61.7 kg).  General appearance: alert and no distress Neck: no adenopathy, no carotid bruit, no JVD, supple, symmetrical, trachea midline, and thyroid not enlarged, symmetric, no tenderness/mass/nodules Lungs: clear to auscultation bilaterally Heart: regular rate and rhythm, S1, S2 normal, no murmur, click, rub or gallop Extremities: extremities normal, atraumatic, no cyanosis or edema Pulses: Diminished pedal pulses, bilateral femoral bruits Skin: Skin color, texture, turgor normal. No rashes or lesions Neurologic: Grossly normal  EKG not performed today  ASSESSMENT AND PLAN:   Peripheral vascular disease ([place]) Ms. Gilland returns for follow-up of her lower  extremity Doppler studies performed in the evaluation of claudication.  These were performed 11/30/2021.  She has a history of bilateral iliac stenting by Dr. Maryjean Morn in an outpatient setting in his office.  Apparently there was a technical complication which sounds like a perforation.  She was transported urgently to [facility] hospital where she was intubated for a week in the ICU receiving blood and platelet transfusion.  She will ultimately then had surgery on her left groin by Dr. Maryjean Morn.  Ultimately, her claudication resolved until last several months when she is noticed left calf claudication.  Dopplers performed 11/29/2021 revealed a right ABI of 0.77 and a left of 0.58.  She had high-frequency signals in both iliac arteries and in the left common femoral artery.  She wishes to proceed with outpatient peripheral angiography and potential endovascular therapy for lifestyle-limiting claudication.     Lorretta Harp MD FACP,FACC,FAHA, [facility]ician [facility] 11/30/2021 10:48 AM

## 2021-12-01 LAB — BASIC METABOLIC PANEL
BUN/Creatinine Ratio: 13 (ref 12–28)
BUN: 13 mg/dL (ref 8–27)
CO2: 23 mmol/L (ref 20–29)
Calcium: 9.4 mg/dL (ref 8.7–10.3)
Chloride: 104 mmol/L (ref 96–106)
Creatinine, Ser: 1 mg/dL (ref 0.57–1.00)
Glucose: 93 mg/dL (ref 70–99)
Potassium: 4.5 mmol/L (ref 3.5–5.2)
Sodium: 139 mmol/L (ref 134–144)
eGFR: 58 mL/min/{1.73_m2} — ABNORMAL LOW (ref >59)

## 2021-12-01 LAB — CBC
Hematocrit: 41.2 % (ref 34.0–46.6)
Hemoglobin: 13.9 g/dL (ref 11.1–15.9)
MCH: 30.4 pg (ref 26.6–33.0)
MCHC: 33.7 g/dL (ref 31.5–35.7)
MCV: 90 fL (ref 79–97)
Platelets: 274 10*3/uL (ref 150–450)
RBC: 4.57 x10E6/uL (ref 3.77–5.28)
RDW: 13.1 % (ref 11.7–15.4)
WBC: 8.1 10*3/uL (ref 3.4–10.8)

## 2021-12-05 ENCOUNTER — Encounter (HOSPITAL_BASED_OUTPATIENT_CLINIC_OR_DEPARTMENT_OTHER): Payer: Self-pay

## 2021-12-05 ENCOUNTER — Ambulatory Visit (HOSPITAL_BASED_OUTPATIENT_CLINIC_OR_DEPARTMENT_OTHER)
Admission: RE | Admit: 2021-12-05 | Discharge: 2021-12-05 | Disposition: A | Discharge status: Home / Self Care | Payer: PPO | Source: Ambulatory Visit | Attending: Cardiovascular Disease | Admitting: Cardiovascular Disease

## 2021-12-05 DIAGNOSIS — I739 Peripheral vascular disease, unspecified: Secondary | ICD-10-CM

## 2021-12-05 DIAGNOSIS — Q272 Other congenital malformations of renal artery: Secondary | ICD-10-CM | POA: Diagnosis not present

## 2021-12-05 DIAGNOSIS — I745 Embolism and thrombosis of iliac artery: Secondary | ICD-10-CM | POA: Diagnosis not present

## 2021-12-05 DIAGNOSIS — D259 Leiomyoma of uterus, unspecified: Secondary | ICD-10-CM | POA: Diagnosis not present

## 2021-12-05 DIAGNOSIS — K5730 Diverticulosis of large intestine without perforation or abscess without bleeding: Secondary | ICD-10-CM | POA: Diagnosis not present

## 2021-12-05 MED ORDER — IOHEXOL 350 MG/ML SOLN
100.0000 mL | Freq: Once | INTRAVENOUS | Status: AC | PRN
  Administered 2021-12-05: 100 mL via INTRAVENOUS

## 2021-12-14 ENCOUNTER — Telehealth: Payer: Self-pay

## 2021-12-14 NOTE — Telephone Encounter (Signed)
Attempted to call pt. Voicemail box is full. Will try again later.

## 2021-12-15 ENCOUNTER — Other Ambulatory Visit: Payer: Self-pay

## 2021-12-15 DIAGNOSIS — I739 Peripheral vascular disease, unspecified: Secondary | ICD-10-CM

## 2021-12-15 MED ORDER — SODIUM CHLORIDE 0.9% FLUSH: 3.0000 mL | Freq: Two times a day (BID) | INTRAVENOUS | Status: DC

## 2021-12-15 NOTE — Telephone Encounter (Signed)
  [place] [address] Dept: [phone] Loc: [phone]  [place] Stites  12/15/2021  You are scheduled for a Peripheral Angiogram on Monday, July 31 with Dr. Quay Burow.  1. Please arrive at the Main Entrance A at [facility]: [place], [place] [address] at 11:00 AM (This time is two hours before your procedure to ensure your preparation). Free valet parking service is available.   Special note: Every effort is made to have your procedure done on time. Please understand that emergencies sometimes delay scheduled procedures.  2. Diet: Do not eat solid foods after midnight.  You may have clear liquids until 5 AM upon the day of the procedure.  3. Labs: You will need to have blood drawn on 11/30/21.  4. Medication instructions in preparation for your procedure:   Stop taking Coumadin (Warfarin) on Thursday, July 27.  Hold Cilostazol (pletal) on Monday, July 31   On the morning of your procedure, take Aspirin and any morning medicines NOT listed above.  You may use sips of water.  5. Plan to go home the same day, you will only stay overnight if medically necessary. 6. You MUST have a responsible adult to drive you home. 7. An adult MUST be with you the first 24 hours after you arrive home. 8. Bring a current list of your medications, and the last time and date medication taken. 9. Bring ID and current insurance cards. 10.Please wear clothes that are easy to get on and off and wear slip-on shoes.  Thank you for allowing [place] to care for you!   -- [facility]  Explained these instructions to pt, she verbalizes understanding and will call back with any questions.

## 2021-12-16 ENCOUNTER — Telehealth: Payer: Self-pay | Admitting: Cardiovascular Disease

## 2021-12-16 ENCOUNTER — Other Ambulatory Visit: Payer: Self-pay

## 2021-12-16 DIAGNOSIS — I739 Peripheral vascular disease, unspecified: Secondary | ICD-10-CM

## 2021-12-16 NOTE — Telephone Encounter (Signed)
Patient has questions about her procedure Monday. She would like to know if Dr. Gwenlyn Found went over the review of Dr. Annice Needy notes. She would like to know if he knows what happened, what went wrong, and how it will be prevented from happening again. She would also like to know what the CT scan showed.

## 2021-12-16 NOTE — Telephone Encounter (Signed)
Spoke with pt, questions answered to patients satisfaction.

## 2021-12-19 ENCOUNTER — Ambulatory Visit (HOSPITAL_COMMUNITY)
Admission: RE | Admit: 2021-12-19 | Discharge: 2021-12-19 | Disposition: A | Discharge status: Home / Self Care | Payer: PPO | Source: Ambulatory Visit | Attending: Cardiovascular Disease | Admitting: Cardiovascular Disease

## 2021-12-19 ENCOUNTER — Encounter (HOSPITAL_COMMUNITY)
Admission: RE | Disposition: A | Discharge status: Home / Self Care | Payer: Self-pay | Source: Ambulatory Visit | Attending: Cardiovascular Disease

## 2021-12-19 ENCOUNTER — Other Ambulatory Visit: Payer: Self-pay

## 2021-12-19 DIAGNOSIS — F17210 Nicotine dependence, cigarettes, uncomplicated: Secondary | ICD-10-CM | POA: Insufficient documentation

## 2021-12-19 DIAGNOSIS — I70212 Atherosclerosis of native arteries of extremities with intermittent claudication, left leg: Secondary | ICD-10-CM | POA: Diagnosis not present

## 2021-12-19 DIAGNOSIS — I252 Old myocardial infarction: Secondary | ICD-10-CM | POA: Diagnosis not present

## 2021-12-19 DIAGNOSIS — Z955 Presence of coronary angioplasty implant and graft: Secondary | ICD-10-CM | POA: Diagnosis not present

## 2021-12-19 DIAGNOSIS — Z7901 Long term (current) use of anticoagulants: Secondary | ICD-10-CM | POA: Diagnosis not present

## 2021-12-19 DIAGNOSIS — E785 Hyperlipidemia, unspecified: Secondary | ICD-10-CM | POA: Insufficient documentation

## 2021-12-19 DIAGNOSIS — I70213 Atherosclerosis of native arteries of extremities with intermittent claudication, bilateral legs: Secondary | ICD-10-CM | POA: Diagnosis not present

## 2021-12-19 DIAGNOSIS — I10 Essential (primary) hypertension: Secondary | ICD-10-CM | POA: Diagnosis not present

## 2021-12-19 DIAGNOSIS — I739 Peripheral vascular disease, unspecified: Secondary | ICD-10-CM

## 2021-12-19 HISTORY — PX: ABDOMINAL AORTOGRAM W/LOWER EXTREMITY: CATH118223

## 2021-12-19 LAB — PROTIME-INR
INR: 1 (ref 0.8–1.2)
Prothrombin Time: 13.2 seconds (ref 11.4–15.2)

## 2021-12-19 SURGERY — ABDOMINAL AORTOGRAM W/LOWER EXTREMITY
Anesthesia: LOCAL | Laterality: Bilateral

## 2021-12-19 MED ORDER — MIDAZOLAM HCL 2 MG/2ML IJ SOLN
INTRAMUSCULAR | Status: AC
  Filled 2021-12-19: qty 2

## 2021-12-19 MED ORDER — DIPHENHYDRAMINE HCL 50 MG/ML IJ SOLN
25.0000 mg | Freq: Once | INTRAMUSCULAR | Status: AC
  Administered 2021-12-19: 25 mg via INTRAVENOUS
  Filled 2021-12-19: qty 1

## 2021-12-19 MED ORDER — FENTANYL CITRATE (PF) 100 MCG/2ML IJ SOLN
INTRAMUSCULAR | Status: AC
  Filled 2021-12-19: qty 2

## 2021-12-19 MED ORDER — SODIUM CHLORIDE 0.9% FLUSH: 3.0000 mL | INTRAVENOUS | Status: DC | PRN

## 2021-12-19 MED ORDER — ASPIRIN 81 MG PO TBEC: 81.0000 mg | DELAYED_RELEASE_TABLET | Freq: Every day | ORAL | Status: DC

## 2021-12-19 MED ORDER — IODIXANOL 320 MG/ML IV SOLN
INTRAVENOUS | Status: DC | PRN
  Administered 2021-12-19: 120 mL

## 2021-12-19 MED ORDER — FENTANYL CITRATE (PF) 100 MCG/2ML IJ SOLN
INTRAMUSCULAR | Status: DC | PRN
  Administered 2021-12-19: 25 ug via INTRAVENOUS

## 2021-12-19 MED ORDER — SODIUM CHLORIDE 0.9 % IV SOLN: INTRAVENOUS | Status: AC

## 2021-12-19 MED ORDER — HEPARIN (PORCINE) IN NACL 1000-0.9 UT/500ML-% IV SOLN
INTRAVENOUS | Status: DC | PRN
  Administered 2021-12-19 (×2): 500 mL

## 2021-12-19 MED ORDER — NITROGLYCERIN 1 MG/10 ML FOR IR/CATH LAB
INTRA_ARTERIAL | Status: DC | PRN
  Administered 2021-12-19: 200 ug

## 2021-12-19 MED ORDER — METHYLPREDNISOLONE SODIUM SUCC 125 MG IJ SOLR
125.0000 mg | Freq: Once | INTRAMUSCULAR | Status: AC
  Administered 2021-12-19: 125 mg via INTRAVENOUS
  Filled 2021-12-19: qty 2

## 2021-12-19 MED ORDER — LIDOCAINE HCL (PF) 1 % IJ SOLN
INTRAMUSCULAR | Status: DC | PRN
  Administered 2021-12-19: 15 mL

## 2021-12-19 MED ORDER — HEPARIN (PORCINE) IN NACL 1000-0.9 UT/500ML-% IV SOLN
INTRAVENOUS | Status: AC
  Filled 2021-12-19: qty 1000

## 2021-12-19 MED ORDER — NITROGLYCERIN 1 MG/10 ML FOR IR/CATH LAB
INTRA_ARTERIAL | Status: AC
  Filled 2021-12-19: qty 10

## 2021-12-19 MED ORDER — SODIUM CHLORIDE 0.9 % WEIGHT BASED INFUSION
3.0000 mL/kg/h | INTRAVENOUS | Status: AC
  Administered 2021-12-19: 3 mL/kg/h via INTRAVENOUS

## 2021-12-19 MED ORDER — MIDAZOLAM HCL 2 MG/2ML IJ SOLN
INTRAMUSCULAR | Status: DC | PRN
  Administered 2021-12-19: 1 mg via INTRAVENOUS

## 2021-12-19 MED ORDER — LIDOCAINE HCL (PF) 1 % IJ SOLN
INTRAMUSCULAR | Status: AC
  Filled 2021-12-19: qty 30

## 2021-12-19 MED ORDER — SODIUM CHLORIDE 0.9 % WEIGHT BASED INFUSION: 1.0000 mL/kg/h | INTRAVENOUS | Status: DC

## 2021-12-19 MED ORDER — ASPIRIN 81 MG PO CHEW
81.0000 mg | CHEWABLE_TABLET | ORAL | Status: AC
  Administered 2021-12-19: 81 mg via ORAL
  Filled 2021-12-19: qty 1

## 2021-12-19 MED ORDER — SODIUM CHLORIDE 0.9 % IV SOLN: 250.0000 mL | INTRAVENOUS | Status: DC | PRN

## 2021-12-19 SURGICAL SUPPLY — 11 items
CATH ANGIO 5F PIGTAIL 65CM (CATHETERS) ×1 IMPLANT
CATH STRAIGHT 5FR 65CM (CATHETERS) ×1 IMPLANT
KIT PV (KITS) ×2 IMPLANT
SHEATH PINNACLE 5F 10CM (SHEATH) ×1 IMPLANT
SHEATH PROBE COVER 6X72 (BAG) ×1 IMPLANT
STOPCOCK MORSE 400PSI 3WAY (MISCELLANEOUS) ×1 IMPLANT
SYR MEDRAD MARK 7 150ML (SYRINGE) ×2 IMPLANT
TRANSDUCER W/STOPCOCK (MISCELLANEOUS) ×2 IMPLANT
TRAY PV CATH (CUSTOM PROCEDURE TRAY) ×2 IMPLANT
TUBING CIL FLEX 10 FLL-RA (TUBING) ×1 IMPLANT
WIRE HITORQ VERSACORE ST 145CM (WIRE) ×1 IMPLANT

## 2021-12-19 NOTE — Interval H&P Note (Signed)
History and Physical Interval Note:  12/19/2021 11:11 AM  Lisa Gilbert  has presented today for surgery, with the diagnosis of pad.  The various methods of treatment have been discussed with the patient and family. After consideration of risks, benefits and other options for treatment, the patient has consented to  Procedure(s): ABDOMINAL AORTOGRAM W/LOWER EXTREMITY (N/A) as a surgical intervention.  The patient's history has been reviewed, patient examined, no change in status, stable for surgery.  I have reviewed the patient's chart and labs.  Questions were answered to the patient's satisfaction.     Quay Burow

## 2021-12-19 NOTE — Progress Notes (Signed)
Site area: Right groin a 5 french arterial sheath was removed by Domenick Bookbinder  Site Prior to Removal:  Level 0  Pressure Applied For 25 MINUTES    Bedrest  Beginning at 1305 X 4 hours.  Manual:   Yes.    Patient Status During Pull:  stable  Post Pull Groin Site:  Level 0  Post Pull Instructions Given:  Yes.    Post Pull Pulses Present:  Yes.    Dressing Applied:  Yes.    Comments:

## 2021-12-20 ENCOUNTER — Encounter (HOSPITAL_COMMUNITY): Payer: Self-pay | Admitting: Cardiovascular Disease

## 2021-12-24 DIAGNOSIS — L570 Actinic keratosis: Secondary | ICD-10-CM | POA: Diagnosis not present

## 2021-12-24 DIAGNOSIS — C44629 Squamous cell carcinoma of skin of left upper limb, including shoulder: Secondary | ICD-10-CM | POA: Diagnosis not present

## 2021-12-24 DIAGNOSIS — L814 Other melanin hyperpigmentation: Secondary | ICD-10-CM | POA: Diagnosis not present

## 2021-12-24 DIAGNOSIS — L821 Other seborrheic keratosis: Secondary | ICD-10-CM | POA: Diagnosis not present

## 2021-12-30 ENCOUNTER — Inpatient Hospital Stay (HOSPITAL_COMMUNITY): Admission: RE | Admit: 2021-12-30 | Payer: PPO | Source: Ambulatory Visit

## 2022-01-03 ENCOUNTER — Encounter: Payer: Self-pay | Admitting: Cardiovascular Disease

## 2022-01-03 ENCOUNTER — Ambulatory Visit: Payer: PPO | Admitting: Cardiovascular Disease

## 2022-01-03 DIAGNOSIS — I739 Peripheral vascular disease, unspecified: Secondary | ICD-10-CM | POA: Diagnosis not present

## 2022-01-03 NOTE — Patient Instructions (Signed)
Medication Instructions:  Your physician recommends that you continue on your current medications as directed. Please refer to the Current Medication list given to you today.  *If you need a refill on your cardiac medications before your next appointment, please call your pharmacy*   Follow-Up: At [facility], you and your health needs are our priority.  As part of our continuing mission to provide you with exceptional heart care, we have created designated Provider Care Teams.  These Care Teams include your primary Cardiologist (physician) and Advanced Practice Providers (APPs -  Physician Assistants and Nurse Practitioners) who all work together to provide you with the care you need, when you need it.  We recommend signing up for the patient portal called "MyChart".  Sign up information is provided on this After Visit Summary.  MyChart is used to connect with patients for Virtual Visits (Telemedicine).  Patients are able to view lab/test results, encounter notes, upcoming appointments, etc.  Non-urgent messages can be sent to your provider as well.   To learn more about what you can do with MyChart, go to [email or web address].    Your next appointment:   3 month(s)  The format for your next appointment:   In Person  Provider:   Jonathan Berry, MD 

## 2022-01-03 NOTE — Assessment & Plan Note (Signed)
Lisa Gilbert returns today after her recent lower extremity arterial angiogram which I performed 12/19/2021.  She had a 90% distal abdominal aortic stenosis with a 40 mm gradient, patent iliac stents bilaterally, 80 and 90% common femoral artery stenoses bilaterally with an occluded left SFA and two-vessel runoff.  She is symptomatic on the left side.  I believe she will need left common femoral endarterectomy plus or minus femoropopliteal bypass grafting and distal abdominal aortic stenting which can probably be done in a hybrid OR setting.

## 2022-01-03 NOTE — Progress Notes (Signed)
01/03/2022 Lisa Gilbert   03-Apr-1946  [id number]  Primary Physician Nicholos Johns, MD Primary Cardiologist: Lorretta Harp MD Lupe Carney, [place]  HPI:  Lisa Gilbert is a 76 y.o.  mildly overweight widowed Caucasian female mother of 2, grandmother of 3 grandchildren referred to me by Dr. Geraldo Pitter, her cardiologist, for peripheral vascular evaluation.  She is retired from working at [employer] where she worked for 30 years.  I last saw her in the office 11/30/2021.  She is accompanied by her daughter Amy today.  Risk factors include over 50 pack years of tobacco abuse currently smoking 1/2 pack/day, treated hypertension and hyperlipidemia.  Apparently her brothers had ischemic heart disease as well.  She had a myocardial infarction in 2015 and had 2 stents placed in her heart at that time in [facility].  She is also had right internal carotid artery stenting.  As well has bilateral lower extremity stents by Dr. Maryjean Morn, vascular surgeon, apparently there was a complication during stent placement requiring emergency surgery.  She has been on Coumadin anticoagulation since for unclear reasons.  There is no history of PAF or DVT that I can determine.  She does complain of left calf claudication.  She had lower extremity arterial Doppler studies performed 11/30/2021 revealing a right ABI of 0.77 and a left of 0.58.  She had high-frequency signals in both iliac arteries as well as in her left common femoral artery.  She did state that after her intervention which was done in the outpatient setting she had what sounds like a perforation in her iliac artery above the left iliac stent requiring urgent transfer to [facility] regional hospital where she was placed in the ICU, intubated and transfused.  She also had surgery on her right groin as well.  Her claudication resolved after that however, over the last several months she developed left calf claudication.  I performed abdominal aortography  with bifemoral runoff 12/19/2021 revealing 90% distal abdominal aortic stenosis with a 40 mm gradient, patent bilateral iliac stents, 80 to 90% bilateral common femoral artery stenoses with a total left SFA reconstituting in the adductor canal with two-vessel runoff.  She is symptomatic on the left side.  I suspect she will need left common femoral endarterectomy with patch angioplasty, left femoropopliteal bypass grafting and distal abdominal aortic stenting.  I am going refer her to Dr. Fortunato Curling, vascular surgery, for surgical evaluation.   Current Meds  Medication Sig   albuterol (VENTOLIN HFA) 108 (90 Base) MCG/ACT inhaler Inhale 2 puffs into the lungs daily.   alendronate (FOSAMAX) 70 MG tablet Take 70 mg by mouth every Saturday.   ALPRAZolam (XANAX) 0.25 MG tablet Take 0.25 mg by mouth daily as needed for anxiety.   aspirin EC 81 MG tablet Take 81 mg by mouth daily.   atorvastatin (LIPITOR) 80 MG tablet Take 80 mg by mouth daily.   Biotin 5000 MCG CAPS Take 5,000 mcg by mouth daily.   buPROPion (WELLBUTRIN XL) 150 MG 24 hr tablet Take 150 mg by mouth daily.   calcium carbonate (OS-CAL) 600 MG tablet Take 600 mg by mouth daily.   cilostazol (PLETAL) 100 MG tablet Take 1 tablet (100 mg total) by mouth 2 (two) times daily.   diclofenac Sodium (VOLTAREN) 1 % GEL Apply 1 Application topically daily as needed (pain).   donepezil (ARICEPT) 10 MG tablet Take 10 mg by mouth daily.   fluticasone (FLONASE) 50 MCG/ACT nasal spray  Place 2 sprays into both nostrils daily as needed for allergies.   levothyroxine (SYNTHROID, LEVOTHROID) 25 MCG tablet Take 25 mcg by mouth daily before breakfast.   lisinopril (PRINIVIL,ZESTRIL) 5 MG tablet Take 5 mg by mouth daily.   loratadine (CLARITIN) 10 MG tablet Take 10 mg by mouth as needed for allergies or rhinitis.   montelukast (SINGULAIR) 10 MG tablet Take 10 mg by mouth at bedtime.   pantoprazole (PROTONIX) 40 MG tablet Take 40 mg by mouth daily.   potassium  chloride SA (KLOR-CON M) 20 MEQ tablet Take 1 tablet (20 mEq total) by mouth as needed. Patient must keep appointment for 11/01/21 for further refills. 3 rd/final attempt   Probiotic Product (PROBIOTIC DAILY PO) Take 1 capsule by mouth daily.   sertraline (ZOLOFT) 50 MG tablet Take 50 mg by mouth daily.   Vitamin D, Ergocalciferol, (DRISDOL) 50000 units CAPS capsule Take 50,000 Units by mouth every Saturday.   warfarin (COUMADIN) 5 MG tablet Take 5-7.5 mg by mouth See admin instructions. 5 mg daily Except takes 7.'5mg'$  on Tuesday and Thursday   Current Facility-Administered Medications for the 01/03/22 encounter (Office Visit) with Lorretta Harp, MD  Medication   sodium chloride flush (NS) 0.9 % injection 3 mL     Allergies  Allergen Reactions   Brilinta [Ticagrelor] Hives   Codeine     Pt can probably take med, unsure of reaction    Iodinated Contrast Media Hives    Broke out in Hives in abdominal area     Social History   Socioeconomic History   Marital status: Married    Spouse name: Not on file   Number of children: Not on file   Years of education: Not on file   Highest education level: Not on file  Occupational History   Not on file  Tobacco Use   Smoking status: Former   Smokeless tobacco: Never  Vaping Use   Vaping Use: Never used  Substance and Sexual Activity   Alcohol use: Not on file   Drug use: Yes    Types: PCP   Sexual activity: Not on file  Other Topics Concern   Not on file  Social History Narrative   Not on file   Social Determinants of Health   Financial Resource Strain: Not on file  Food Insecurity: Not on file  Transportation Needs: Not on file  Physical Activity: Not on file  Stress: Not on file  Social Connections: Not on file  Intimate Partner Violence: Not on file     Review of Systems: General: negative for chills, fever, night sweats or weight changes.  Cardiovascular: negative for chest pain, dyspnea on exertion, edema, orthopnea,  palpitations, paroxysmal nocturnal dyspnea or shortness of breath Dermatological: negative for rash Respiratory: negative for cough or wheezing Urologic: negative for hematuria Abdominal: negative for nausea, vomiting, diarrhea, bright red blood per rectum, melena, or hematemesis Neurologic: negative for visual changes, syncope, or dizziness All other systems reviewed and are otherwise negative except as noted above.    Blood pressure 110/60, pulse 78, height '5\' 3"'$  (1.6 m), weight 134 lb 3.2 oz (60.9 kg), SpO2 95 %.  General appearance: alert and no distress Neck: no adenopathy, no JVD, supple, symmetrical, trachea midline, thyroid not enlarged, symmetric, no tenderness/mass/nodules, and lateral carotid bruits Lungs: clear to auscultation bilaterally Heart: regular rate and rhythm, S1, S2 normal, no murmur, click, rub or gallop Extremities: extremities normal, atraumatic, no cyanosis or edema Pulses: Diminished pedal pulses Skin:  Skin color, texture, turgor normal. No rashes or lesions Neurologic: Grossly normal  EKG not performed today  ASSESSMENT AND PLAN:   Peripheral vascular disease ([place]) Ms. Glacken returns today after her recent lower extremity arterial angiogram which I performed 12/19/2021.  She had a 90% distal abdominal aortic stenosis with a 40 mm gradient, patent iliac stents bilaterally, 80 and 90% common femoral artery stenoses bilaterally with an occluded left SFA and two-vessel runoff.  She is symptomatic on the left side.  I believe she will need left common femoral endarterectomy plus or minus femoropopliteal bypass grafting and distal abdominal aortic stenting which can probably be done in a hybrid OR setting.     Lorretta Harp MD FACP,FACC,FAHA, [facility] 01/03/2022 10:23 AM

## 2022-01-24 ENCOUNTER — Encounter: Payer: Self-pay | Admitting: Vascular Surgery

## 2022-01-24 ENCOUNTER — Ambulatory Visit: Payer: PPO | Admitting: Vascular Surgery

## 2022-01-24 VITALS — BP 149/69 | HR 63 | Temp 97.8°F | Resp 14 | Ht 60.0 in | Wt 134.0 lb

## 2022-01-24 DIAGNOSIS — I739 Peripheral vascular disease, unspecified: Secondary | ICD-10-CM

## 2022-01-24 NOTE — Progress Notes (Signed)
Patient name: Lisa Gilbert MRN: [id number] DOB: 01/22/1946 Sex: female  REASON FOR CONSULT: Evaluate recurrent left lower extremity claudication  HPI: Lisa Gilbert is a 76 y.o. female, with history of hypertension, hyperlipidemia, right carotid stent at [facility] by Dr. Maryjean Morn, tobacco abuse, peripheral vascular disease, coronary artery disease status post MI that presents for evaluation of left leg claudication as a referral from Dr. Gwenlyn Found.  She had a complex history and underwent bilateral common iliac artery and left external iliac artery stenting by Dr. Maryjean Morn in [facility] and then had significant postop bleed requiring left common femoral endarterectomy with profundoplasty around 2015.  At the time she was having disabling claudication in the left leg.  She has had recurrent symptoms.  Her ABIs on 11/30/2021 were 0.77 on the right 0.5 on the left.  She had a CTA on 12/05/2021 showing a 75% distal abdominal aortic stenosis as well as diseased left common femoral artery with occluded SFA and dominant profunda runoff.  She has also recently undergone angiogram with Dr. Gwenlyn Found.  States her left calf bothers her when she walks up hills.  She is able to get around the store at [facility] without any issues.  She is able to do most of her daily activities.  No rest pain or tissue loss to suggest critical limb ischemia.  Claudication in left leg not nearly as severe as when Dr. Maryjean Morn operated.  Still smoking about half pack a day.  Abdominal surgery includes C-section as well as repair of the left lower quadrant hernia from Dr. Maryjean Morn after his RP exposure for her groin bleed.  Past Medical History:  Diagnosis Date   Abnormal INR 11/30/2015   Allergic rhinosinusitis    Anticoagulated on Coumadin 03/24/2019   Asymptomatic bilateral carotid artery stenosis 09/30/2015   Atherosclerosis 01/14/2018   Bilateral carotid bruits 01/18/2018   Bronchial asthma    Chronic diarrhea    Coronary artery disease  involving native coronary artery of native heart without angina pectoris 12/04/2019   Depression 01/14/2018   Disease of thyroid gland 01/14/2018   hypothyroidism   Encounter for therapeutic drug level monitoring 05/06/2015   Essential hypertension 05/20/2019   Ex-smoker 01/18/2018   GI bleed 03/16/2015   History of right-sided carotid endarterectomy    Hyperlipidemia    Hypothyroidism    IBS (irritable bowel syndrome)    Incisional hernia 07/19/2016   Long term current use of anticoagulant therapy 05/06/2015   Mild memory disturbance    Mixed hyperlipidemia 05/20/2019   Osteoporosis    Overweight    Peripheral vascular disease ([place]) 05/06/2015   Vitamin D deficiency     Past Surgical History:  Procedure Laterality Date   ABDOMINAL AORTOGRAM W/LOWER EXTREMITY Bilateral 12/19/2021   Procedure: ABDOMINAL AORTOGRAM W/LOWER EXTREMITY;  Surgeon: Lorretta Harp, MD;  Location: [facility];  Service: Cardiovascular;  Laterality: Bilateral;   CAROTID STENT     CORONARY ANGIOPLASTY WITH STENT PLACEMENT  2015   2 Stents   HERNIA REPAIR  03/31/2019   Dr. Orrin Brigham III MD at [facility]    Family History  Problem Relation Age of Onset   Hypertension Mother    Breast cancer Mother    Kidney cancer Father    Diabetes Sister    Heart Problems Sister    Diabetes Brother    Heart Problems Brother     SOCIAL HISTORY: Social History   Socioeconomic History   Marital status: Married  Spouse name: Not on file   Number of children: Not on file   Years of education: Not on file   Highest education level: Not on file  Occupational History   Not on file  Tobacco Use   Smoking status: Former   Smokeless tobacco: Never  Vaping Use   Vaping Use: Never used  Substance and Sexual Activity   Alcohol use: Not on file   Drug use: Yes    Types: PCP   Sexual activity: Not on file  Other Topics Concern   Not on file  Social History Narrative   Not on file   Social Determinants  of Health   Financial Resource Strain: Not on file  Food Insecurity: Not on file  Transportation Needs: Not on file  Physical Activity: Not on file  Stress: Not on file  Social Connections: Not on file  Intimate Partner Violence: Not on file    Allergies  Allergen Reactions   Brilinta [Ticagrelor] Hives   Codeine     Pt can probably take med, unsure of reaction    Iodinated Contrast Media Hives    Broke out in Hives in abdominal area     Current Outpatient Medications  Medication Sig Dispense Refill   albuterol (VENTOLIN HFA) 108 (90 Base) MCG/ACT inhaler Inhale 2 puffs into the lungs daily.     alendronate (FOSAMAX) 70 MG tablet Take 70 mg by mouth every Saturday.     ALPRAZolam (XANAX) 0.25 MG tablet Take 0.25 mg by mouth daily as needed for anxiety.     aspirin EC 81 MG tablet Take 81 mg by mouth daily.     atorvastatin (LIPITOR) 80 MG tablet Take 80 mg by mouth daily.     Biotin 5000 MCG CAPS Take 5,000 mcg by mouth daily.     buPROPion (WELLBUTRIN XL) 150 MG 24 hr tablet Take 150 mg by mouth daily.     calcium carbonate (OS-CAL) 600 MG tablet Take 600 mg by mouth daily.     cilostazol (PLETAL) 100 MG tablet Take 1 tablet (100 mg total) by mouth 2 (two) times daily. 180 tablet 1   diclofenac Sodium (VOLTAREN) 1 % GEL Apply 1 Application topically daily as needed (pain).     donepezil (ARICEPT) 10 MG tablet Take 10 mg by mouth daily.     fluticasone (FLONASE) 50 MCG/ACT nasal spray Place 2 sprays into both nostrils daily as needed for allergies.     levothyroxine (SYNTHROID, LEVOTHROID) 25 MCG tablet Take 25 mcg by mouth daily before breakfast.     lisinopril (PRINIVIL,ZESTRIL) 5 MG tablet Take 5 mg by mouth daily.     loratadine (CLARITIN) 10 MG tablet Take 10 mg by mouth as needed for allergies or rhinitis.     montelukast (SINGULAIR) 10 MG tablet Take 10 mg by mouth at bedtime.     pantoprazole (PROTONIX) 40 MG tablet Take 40 mg by mouth daily.     potassium chloride SA  (KLOR-CON M) 20 MEQ tablet Take 1 tablet (20 mEq total) by mouth as needed. Patient must keep appointment for 11/01/21 for further refills. 3 rd/final attempt 56 tablet 0   Probiotic Product (PROBIOTIC DAILY PO) Take 1 capsule by mouth daily.     sertraline (ZOLOFT) 50 MG tablet Take 50 mg by mouth daily.     Vitamin D, Ergocalciferol, (DRISDOL) 50000 units CAPS capsule Take 50,000 Units by mouth every Saturday.     warfarin (COUMADIN) 5 MG tablet Take 5-7.5 mg  by mouth See admin instructions. 5 mg daily Except takes 7.'5mg'$  on Tuesday and Thursday     Current Facility-Administered Medications  Medication Dose Route Frequency Provider Last Rate Last Admin   sodium chloride flush (NS) 0.9 % injection 3 mL  3 mL Intravenous Q12H Lorretta Harp, MD        REVIEW OF SYSTEMS:  '[X]'$  denotes positive finding, '[ ]'$  denotes negative finding Cardiac  Comments:  Chest pain or chest pressure:    Shortness of breath upon exertion:    Short of breath when lying flat:    Irregular heart rhythm:        Vascular    Pain in calf, thigh, or hip brought on by ambulation: x left  Pain in feet at night that wakes you up from your sleep:     Blood clot in your veins:    Leg swelling:         Pulmonary    Oxygen at home:    Productive cough:     Wheezing:         Neurologic    Sudden weakness in arms or legs:     Sudden numbness in arms or legs:     Sudden onset of difficulty speaking or slurred speech:    Temporary loss of vision in one eye:     Problems with dizziness:         Gastrointestinal    Blood in stool:     Vomited blood:         Genitourinary    Burning when urinating:     Blood in urine:        Psychiatric    Major depression:         Hematologic    Bleeding problems:    Problems with blood clotting too easily:        Skin    Rashes or ulcers:        Constitutional    Fever or chills:      PHYSICAL EXAM: Vitals:   01/24/22 0921 01/24/22 0925  BP: (!) 142/69 (!) 149/69   Pulse: 63 63  Resp: 14   Temp: 97.8 F (36.6 C)   TempSrc: Temporal   SpO2: 92%   Weight: 134 lb (60.8 kg)   Height: 5' (1.524 m)     GENERAL: The patient is a well-nourished female, in no acute distress. The vital signs are documented above. CARDIAC: There is a regular rate and rhythm.  VASCULAR:  1+ palpable femoral pulses bilaterally No palpable pedal pulses but no tissue loss PULMONARY: No respiratory distress. ABDOMEN: Soft and non-tender. MUSCULOSKELETAL: There are no major deformities or cyanosis. NEUROLOGIC: No focal weakness or paresthesias are detected. SKIN: There are no ulcers or rashes noted. PSYCHIATRIC: The patient has a normal affect.  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   EXAM: CT ANGIOGRAPHY ABDOMEN AND PELVIS WITH CONTRAST AND WITHOUT CONTRAST   TECHNIQUE: Multidetector CT imaging of the abdomen and pelvis was performed using the standard protocol during bolus administration of intravenous contrast. Multiplanar reconstructed images and MIPs were obtained and reviewed to evaluate the vascular anatomy.   RADIATION DOSE REDUCTION: This exam was performed according to the departmental dose-optimization program which includes automated exposure control, adjustment of the mA and/or kV according to patient size and/or use of iterative reconstruction technique.   CONTRAST:  141m OMNIPAQUE IOHEXOL 350 MG/ML SOLN  COMPARISON:  CT abdomen pelvis from 03/03/2019   FINDINGS: VASCULAR   Aorta: Patent throughout. No evidence of aneurysm. About the distal infrarenal abdominal aorta is approximately 75% focal stenosis secondary to prominent calcified and fibrofatty atherosclerotic plaque that extends into the iliac bifurcation.   Celiac: Mild ostial stenosis secondary to atherosclerotic plaque. Patent distally.   SMA: Patent without evidence of aneurysm, dissection, vasculitis  or significant stenosis.   Renals: Single right and dual left renal arteries are patent. There is an accessory right inferior pole renal artery arising from the distal abdominal aorta just prior to the bifurcation and distal to the aforementioned stenotic plaque.   IMA: Patent without evidence of aneurysm, dissection, vasculitis or significant stenosis.   Inflow: Coarse atherosclerotic calcifications about the bilateral common iliac arteries. The proximal right common iliac artery is patent. Indwelling distal right common iliac artery stent is widely patent. Indwelling left common to external iliac artery stents are widely patent.   Proximal Outflow: Severely diminutive left common femoral artery and occlusion of the proximal, visualized left superficial femoral artery. The left profundal is patent. The right common, superficial, and profunda femoral arteries are patent however diminutive with atherosclerotic calcifications.   Veins: No obvious venous abnormality within the limitations of this arterial phase study.   Review of the MIP images confirms the above findings.   NON-VASCULAR   Lower chest: No acute abnormality.   Hepatobiliary: The liver is normal in size, contour, and attenuation. There is a anterior, 6 mm subcapsular enhancing focus in segment 5, favored represent flash filling hemangioma. The gallbladder is present unremarkable. No intra or extrahepatic biliary ductal dilation.   Pancreas: Unremarkable. No pancreatic ductal dilatation or surrounding inflammatory changes.   Spleen: Normal in size without focal abnormality.   Adrenals/Urinary Tract: Similar appearing left superior adrenal nodule measuring up to 1.2 cm which contain fat and is consistent with a benign myelolipoma the right adrenal gland is normal in size morphology. The kidneys are symmetric in size and enhancement bilaterally. No focal masses or evidence of nephrolithiasis. Bilateral  extrarenal pelves. No hydronephrosis. The urinary bladder is within normal limits.   Stomach/Bowel: Stomach is within normal limits. Appendix appears normal. Sigmoid diverticula without surrounding inflammatory changes. No evidence of bowel wall thickening, distention, or inflammatory changes.   Lymphatic: No abdominopelvic lymphadenopathy.   Reproductive: The uterus is present with a few small calcified fibroids. No adnexal masses.   Other: Decreased size of previously visualized left lower quadrant ventral hernia containing fat and bowel, no evidence of obstruction or strangulation. No ascites.   Musculoskeletal: No acute osseous abnormality. Unchanged left iliac bone island.   IMPRESSION: VASCULAR   1. Severe aortoiliac atherosclerotic disease with moderate to severe focal stenosis about the distal aorta secondary to atherosclerotic plaque. 2. Patent indwelling left common to external iliac and right common iliac artery stents. 3. Diminutive left common femoral artery and occluded proximal left superficial femoral artery.   NON-VASCULAR   1. No acute abdominopelvic abnormality. 2. Sigmoid diverticulosis. 3. Decreased size of previously visualized left lower quadrant ventral hernia containing fat and bowel, no complicating features.  Assessment/Plan:  76 y.o. female, with history of hypertension, hyperlipidemia, right carotid stent at [facility] by Dr. Maryjean Morn, tobacco abuse, peripheral vascular disease, coronary artery disease status post MI that presents for evaluation of left leg claudication as a referral from Dr. Gwenlyn Found.  She had a complex history and underwent bilateral common iliac artery and left external iliac artery stenting by Dr. Maryjean Morn  in [facility] and then had significant postop bleed requiring left common femoral endarterectomy with profundoplasty around 2015.  She now has recurrent left leg claudication.  I have reviewed her CTA as well as arteriogram imaging  from Dr. Gwenlyn Found.  She has a high-grade stenosis of her distal abdominal aorta as well as a very diseased high-grade stenosis with a diminutive left common femoral artery with dominant profunda runoff and an occluded SFA.  Discussed with her and her daughter I think her best option would be aortobifemoral bypass.  That being said, her claudication is not disabling at this time.  She is able to do most of her activities of daily living including walk through the store, etc.  No evidence of critical limb ischemia with rest pain or tissue loss.  We agreed on continued conservative management for now.  I have encouraged smoking cessation as well as walking therapies.  I will plan to see her again in 6 months.  My concern is a hybrid endovascular approach with redo left femoral endarterectomy with retrograde stenting of her abdominal aorta would likely have very poor durability especially with her still smoking.   Marty Heck, MD [facility] Office: [phone]

## 2022-01-26 ENCOUNTER — Other Ambulatory Visit: Payer: Self-pay

## 2022-01-26 DIAGNOSIS — I739 Peripheral vascular disease, unspecified: Secondary | ICD-10-CM

## 2022-02-15 DIAGNOSIS — Z7901 Long term (current) use of anticoagulants: Secondary | ICD-10-CM | POA: Diagnosis not present

## 2022-02-28 DIAGNOSIS — E785 Hyperlipidemia, unspecified: Secondary | ICD-10-CM | POA: Diagnosis not present

## 2022-02-28 DIAGNOSIS — I2510 Atherosclerotic heart disease of native coronary artery without angina pectoris: Secondary | ICD-10-CM | POA: Diagnosis not present

## 2022-02-28 DIAGNOSIS — E039 Hypothyroidism, unspecified: Secondary | ICD-10-CM | POA: Diagnosis not present

## 2022-02-28 DIAGNOSIS — Z23 Encounter for immunization: Secondary | ICD-10-CM | POA: Diagnosis not present

## 2022-02-28 DIAGNOSIS — M810 Age-related osteoporosis without current pathological fracture: Secondary | ICD-10-CM | POA: Diagnosis not present

## 2022-02-28 DIAGNOSIS — Z7901 Long term (current) use of anticoagulants: Secondary | ICD-10-CM | POA: Diagnosis not present

## 2022-02-28 DIAGNOSIS — Z6826 Body mass index (BMI) 26.0-26.9, adult: Secondary | ICD-10-CM | POA: Diagnosis not present

## 2022-02-28 DIAGNOSIS — I10 Essential (primary) hypertension: Secondary | ICD-10-CM | POA: Diagnosis not present

## 2022-02-28 DIAGNOSIS — R413 Other amnesia: Secondary | ICD-10-CM | POA: Diagnosis not present

## 2022-02-28 DIAGNOSIS — I739 Peripheral vascular disease, unspecified: Secondary | ICD-10-CM | POA: Diagnosis not present

## 2022-03-25 DIAGNOSIS — R0981 Nasal congestion: Secondary | ICD-10-CM | POA: Diagnosis not present

## 2022-03-25 DIAGNOSIS — J0100 Acute maxillary sinusitis, unspecified: Secondary | ICD-10-CM | POA: Diagnosis not present

## 2022-03-31 DIAGNOSIS — K047 Periapical abscess without sinus: Secondary | ICD-10-CM | POA: Diagnosis not present

## 2022-03-31 DIAGNOSIS — R6884 Jaw pain: Secondary | ICD-10-CM | POA: Diagnosis not present

## 2022-04-05 ENCOUNTER — Encounter: Payer: Self-pay | Admitting: Cardiovascular Disease

## 2022-04-05 ENCOUNTER — Ambulatory Visit: Payer: PPO | Attending: Cardiovascular Disease | Admitting: Cardiovascular Disease

## 2022-04-05 VITALS — BP 108/62 | HR 94 | Ht 61.0 in | Wt 132.0 lb

## 2022-04-05 DIAGNOSIS — I10 Essential (primary) hypertension: Secondary | ICD-10-CM

## 2022-04-05 DIAGNOSIS — I739 Peripheral vascular disease, unspecified: Secondary | ICD-10-CM | POA: Diagnosis not present

## 2022-04-05 DIAGNOSIS — Z87891 Personal history of nicotine dependence: Secondary | ICD-10-CM | POA: Diagnosis not present

## 2022-04-05 DIAGNOSIS — I6523 Occlusion and stenosis of bilateral carotid arteries: Secondary | ICD-10-CM

## 2022-04-05 DIAGNOSIS — E782 Mixed hyperlipidemia: Secondary | ICD-10-CM | POA: Diagnosis not present

## 2022-04-05 DIAGNOSIS — I2510 Atherosclerotic heart disease of native coronary artery without angina pectoris: Secondary | ICD-10-CM | POA: Diagnosis not present

## 2022-04-05 MED ORDER — WARFARIN SODIUM 5 MG PO TABS: 5.0000 mg | ORAL_TABLET | ORAL | 1 refills | Status: AC

## 2022-04-05 NOTE — Assessment & Plan Note (Signed)
History of essential hypertension blood pressure measured today at 108/62.  She is on lisinopril.

## 2022-04-05 NOTE — Patient Instructions (Addendum)
Medication Instructions:  Your physician recommends that you continue on your current medications as directed. Please refer to the Current Medication list given to you today.   *If you need a refill on your cardiac medications before your next appointment, please call your pharmacy*   Lab Work: Your physician recommends that you have labs drawn today: PT/INR  If you have labs (blood work) drawn today and your tests are completely normal, you will receive your results only by: [place] (if you have MyChart) OR A paper copy in the mail If you have any lab test that is abnormal or we need to change your treatment, we will call you to review the results.   Testing/Procedures: Your physician has requested that you have a carotid duplex. This test is an ultrasound of the carotid arteries in your neck. It looks at blood flow through these arteries that supply the brain with blood. Allow one hour for this exam. There are no restrictions or special instructions. This will take place at [place], [address].    Follow-Up: At [facility], you and your health needs are our priority.  As part of our continuing mission to provide you with exceptional heart care, we have created designated Provider Care Teams.  These Care Teams include your primary Cardiologist (physician) and Advanced Practice Providers (APPs -  Physician Assistants and Nurse Practitioners) who all work together to provide you with the care you need, when you need it.  We recommend signing up for the patient portal called "MyChart".  Sign up information is provided on this After Visit Summary.  MyChart is used to connect with patients for Virtual Visits (Telemedicine).  Patients are able to view lab/test results, encounter notes, upcoming appointments, etc.  Non-urgent messages can be sent to your provider as well.   To learn more about what you can do with MyChart, go to [email or web address].    Your next  appointment:   6 month(s)  The format for your next appointment:   In Person  Provider:   Quay Burow, MD

## 2022-04-05 NOTE — Assessment & Plan Note (Signed)
History of peripheral arterial disease status post bilateral iliac stenting by Dr. Maryjean Morn remotely.  This was complicated by dissection and bleeding requiring ICU hospitalization.  I performed Dopplers on her 11/30/2021 revealing a right ABI of 0.77 and left of 0.58.  I performed angiography on her 12/19/2021 revealing 90% distal abdominal aortic stenosis, patent iliac stents, 80 to 90% bilateral common femoral artery stenoses with high-grade bilateral SFA disease.  I did refer her to Dr. Carlis Abbott for consideration of surgical revascularization.  The options were aortobifemoral bypass grafting versus left common femoral endarterectomy with stenting of her distal abdominal aorta and hybrid procedure however he elected to treat her medically at this time.  She does have lifestyle-limiting claudication but it is not severe.

## 2022-04-05 NOTE — Assessment & Plan Note (Signed)
Ongoing tobacco abuse with intent to decrease/stop

## 2022-04-05 NOTE — Assessment & Plan Note (Signed)
History of remote CAD/stenting in 2015 in [facility].  She denies chest pain.

## 2022-04-05 NOTE — Progress Notes (Signed)
04/05/2022 Lisa Gilbert   27-Jul-1945  [id number]  Primary Physician Nicholos Johns, MD Primary Cardiologist: Lorretta Harp MD Lupe Car[place], [place]  HPI:  Lisa Gilbert is a 76 y.o.  mildly overweight widowed Caucasian female mother of 2, grandmother of 3 grandchildren referred to me by Dr. Geraldo Gilbert, her cardiologist, for peripheral vascular evaluation.  She is retired from working at [employer] where she worked for 30 years.  I last saw her in the office 01/03/2022.  She is accompanied by her daughter Amy today.  Risk factors include over 50 pack years of tobacco abuse currently smoking 1/2 pack/day, treated hypertension and hyperlipidemia.  Apparently her brothers had ischemic heart disease as well.  She had a myocardial infarction in 2015 and had 2 stents placed in her heart at that time in [facility].  She is also had right internal carotid artery stenting.  As well has bilateral lower extremity stents by Dr. Maryjean Morn, vascular surgeon, apparently there was a complication during stent placement requiring emergency surgery.  She has been on Coumadin anticoagulation since for unclear reasons.  There is no history of PAF or DVT that I can determine.  She does complain of left calf claudication.  She had lower extremity arterial Doppler studies performed 11/30/2021 revealing a right ABI of 0.77 and a left of 0.58.  She had high-frequency signals in both iliac arteries as well as in her left common femoral artery.  She did state that after her intervention which was done in the outpatient setting she had what sounds like a perforation in her iliac artery above the left iliac stent requiring urgent transfer to [facility] where she was placed in the ICU, intubated and transfused.  She also had surgery on her right groin as well.  Her claudication resolved after that however, over the last several months she developed left calf claudication.   I performed abdominal  aortography with bifemoral runoff 12/19/2021 revealing 90% distal abdominal aortic stenosis with a 40 mm gradient, patent bilateral iliac stents, 80 to 90% bilateral common femoral artery stenoses with a total left SFA reconstituting in the adductor canal with two-vessel runoff.  She is symptomatic on the left side.  I suspect she will need left common femoral endarterectomy with patch angioplasty, left femoropopliteal bypass grafting and distal abdominal aortic stenting.   Since I saw her 3 months ago I did send her to Dr. Fortunato Curling, vascular surgeon for consideration of surgical revascularization.  Options include aortobifemoral bypass grafting versus hybrid procedure with left common femoral endarterectomy/patch angioplasty with retrograde abdominal aortic stenting.  He preferred conservative approach now since she is still smoking.  She denies chest pain or shortness of breath.  I do not see a reason for her to be on Coumadin and therefore recommended that she discontinue this.  Current Meds  Medication Sig   albuterol (VENTOLIN HFA) 108 (90 Base) MCG/ACT inhaler Inhale 2 puffs into the lungs daily.   alendronate (FOSAMAX) 70 MG tablet Take 70 mg by mouth every Saturday.   ALPRAZolam (XANAX) 0.25 MG tablet Take 0.25 mg by mouth daily as needed for anxiety.   aspirin EC 81 MG tablet Take 81 mg by mouth daily.   atorvastatin (LIPITOR) 80 MG tablet Take 80 mg by mouth daily.   Biotin 5000 MCG CAPS Take 5,000 mcg by mouth daily.   buPROPion (WELLBUTRIN XL) 150 MG 24 hr tablet Take 150 mg by mouth daily.  calcium carbonate (OS-CAL) 600 MG tablet Take 600 mg by mouth daily.   cilostazol (PLETAL) 100 MG tablet Take 1 tablet (100 mg total) by mouth 2 (two) times daily.   clindamycin (CLEOCIN) 300 MG capsule Take 300 mg by mouth.   diclofenac Sodium (VOLTAREN) 1 % GEL Apply 1 Application topically daily as needed (pain).   donepezil (ARICEPT) 10 MG tablet Take 10 mg by mouth daily.   fluticasone  (FLONASE) 50 MCG/ACT nasal spray Place 2 sprays into both nostrils daily as needed for allergies.   levothyroxine (SYNTHROID, LEVOTHROID) 25 MCG tablet Take 25 mcg by mouth daily before breakfast.   lisinopril (PRINIVIL,ZESTRIL) 5 MG tablet Take 5 mg by mouth daily.   loratadine (CLARITIN) 10 MG tablet Take 10 mg by mouth as needed for allergies or rhinitis.   montelukast (SINGULAIR) 10 MG tablet Take 10 mg by mouth at bedtime.   pantoprazole (PROTONIX) 40 MG tablet Take 40 mg by mouth daily.   potassium chloride SA (KLOR-CON M) 20 MEQ tablet Take 1 tablet (20 mEq total) by mouth as needed. Patient must keep appointment for 11/01/21 for further refills. 3 rd/final attempt   Probiotic Product (PROBIOTIC DAILY PO) Take 1 capsule by mouth daily.   sertraline (ZOLOFT) 50 MG tablet Take 50 mg by mouth daily.   Vitamin D, Ergocalciferol, (DRISDOL) 50000 units CAPS capsule Take 50,000 Units by mouth every Saturday.   [DISCONTINUED] warfarin (COUMADIN) 5 MG tablet Take 5-7.5 mg by mouth See admin instructions. 5 mg daily Except takes 7.'5mg'$  on Tuesday and Thursday   Current Facility-Administered Medications for the 04/05/22 encounter (Office Visit) with Lorretta Harp, MD  Medication   sodium chloride flush (NS) 0.9 % injection 3 mL     Allergies  Allergen Reactions   Brilinta [Ticagrelor] Hives   Codeine     Pt can probably take med, unsure of reaction    Iodinated Contrast Media Hives    Broke out in Hives in abdominal area     Social History   Socioeconomic History   Marital status: Married    Spouse name: Not on file   Number of children: Not on file   Years of education: Not on file   Highest education level: Not on file  Occupational History   Not on file  Tobacco Use   Smoking status: Former   Smokeless tobacco: Never  Vaping Use   Vaping Use: Never used  Substance and Sexual Activity   Alcohol use: Not on file   Drug use: Yes    Types: PCP   Sexual activity: Not on file   Other Topics Concern   Not on file  Social History Narrative   Not on file   Social Determinants of Health   Financial Resource Strain: Not on file  Food Insecurity: Not on file  Transportation Needs: Not on file  Physical Activity: Not on file  Stress: Not on file  Social Connections: Not on file  Intimate Partner Violence: Not on file     Review of Systems: General: negative for chills, fever, night sweats or weight changes.  Cardiovascular: negative for chest pain, dyspnea on exertion, edema, orthopnea, palpitations, paroxysmal nocturnal dyspnea or shortness of breath Dermatological: negative for rash Respiratory: negative for cough or wheezing Urologic: negative for hematuria Abdominal: negative for nausea, vomiting, diarrhea, bright red blood per rectum, melena, or hematemesis Neurologic: negative for visual changes, syncope, or dizziness All other systems reviewed and are otherwise negative except as noted above.  Blood pressure 108/62, pulse 94, height '5\' 1"'$  (1.549 m), weight 132 lb (59.9 kg), SpO2 97 %.  General appearance: alert and no distress Neck: no adenopathy, no JVD, supple, symmetrical, trachea midline, thyroid not enlarged, symmetric, no tenderness/mass/nodules, and bilateral carotid bruits Lungs: clear to auscultation bilaterally Heart: 2/6 ejection murmur at the base consistent with aortic stenosis. Extremities: extremities normal, atraumatic, no cyanosis or edema Pulses: Diminished pedal pulses Skin: Skin color, texture, turgor normal. No rashes or lesions Neurologic: Grossly normal  EKG not performed today  ASSESSMENT AND PLAN:   Asymptomatic bilateral carotid artery stenosis History of carotid artery disease status post remote carotid stenting on the right by Dr. Maryjean Morn.  She does have bilateral carotid bruits.  We will check carotid Doppler studies.  Peripheral vascular disease ([place]) History of peripheral arterial disease status post bilateral  iliac stenting by Dr. Maryjean Morn remotely.  This was complicated by dissection and bleeding requiring ICU hospitalization.  I performed Dopplers on her 11/30/2021 revealing a right ABI of 0.77 and left of 0.58.  I performed angiography on her 12/19/2021 revealing 90% distal abdominal aortic stenosis, patent iliac stents, 80 to 90% bilateral common femoral artery stenoses with high-grade bilateral SFA disease.  I did refer her to Dr. Carlis Abbott for consideration of surgical revascularization.  The options were aortobifemoral bypass grafting versus left common femoral endarterectomy with stenting of her distal abdominal aorta and hybrid procedure however he elected to treat her medically at this time.  She does have lifestyle-limiting claudication but it is not severe.  Ex-smoker Ongoing tobacco abuse with intent to decrease/stop  Mixed hyperlipidemia History of hyperlipidemia on statin therapy with lipid profile performed/13/23 revealing total cholesterol 172, LDL of 87 and HDL 55.  She does admit to dietary discretion.  Essential hypertension History of essential hypertension blood pressure measured today at 108/62.  She is on lisinopril.  Coronary artery disease involving native coronary artery of native heart without angina pectoris History of remote CAD/stenting in 2015 in [facility].  She denies chest pain.     Lorretta Harp MD FACP,FACC,FAHA, [facility] 04/05/2022 11:10 AM

## 2022-04-05 NOTE — Assessment & Plan Note (Signed)
History of hyperlipidemia on statin therapy with lipid profile performed/13/23 revealing total cholesterol 172, LDL of 87 and HDL 55.  She does admit to dietary discretion.

## 2022-04-05 NOTE — Assessment & Plan Note (Signed)
History of carotid artery disease status post remote carotid stenting on the right by Dr. Maryjean Morn.  She does have bilateral carotid bruits.  We will check carotid Doppler studies.

## 2022-04-06 LAB — PROTIME-INR
INR: 1 (ref 0.9–1.2)
Prothrombin Time: 11.1 s (ref 9.1–12.0)

## 2022-04-07 ENCOUNTER — Telehealth: Payer: Self-pay

## 2022-04-07 ENCOUNTER — Ambulatory Visit: Payer: PPO | Admitting: Podiatry

## 2022-04-07 ENCOUNTER — Ambulatory Visit (INDEPENDENT_AMBULATORY_CARE_PROVIDER_SITE_OTHER): Payer: PPO

## 2022-04-07 DIAGNOSIS — M7751 Other enthesopathy of right foot: Secondary | ICD-10-CM

## 2022-04-07 DIAGNOSIS — M778 Other enthesopathies, not elsewhere classified: Secondary | ICD-10-CM | POA: Diagnosis not present

## 2022-04-07 MED ORDER — METHYLPREDNISOLONE 4 MG PO TBPK: ORAL_TABLET | ORAL | 0 refills | Status: DC

## 2022-04-07 NOTE — Telephone Encounter (Signed)
Received call from patient.She was calling to get results of INR done this week after her appointment with Dr.Berry.patient was told INR done 11/15 1.0.Stated she already dental procedure done.She will restart Coumadin tonight.Advised to call PCP who follows INR's to find out when she needs a repeat INR.I will make Dr.Berry's RN aware.

## 2022-04-07 NOTE — Progress Notes (Signed)
Subjective:  Patient ID: Lisa Gilbert, female    DOB: Sep 22, 1945,  MRN: [id number]  Chief Complaint  Patient presents with   Foot Pain    right foot pain starting tuesday night and can hardly walk    76 y.o. female presents with acute pain in the plantar medial arch of the right foot.  She says that this pain started earlier this week on Tuesday night and she is having difficulty walking at this point time.  She says it does not feel like it is in the bottom of the heel but rather in the arch.  Worse when she is walking.  Past Medical History:  Diagnosis Date   Abnormal INR 11/30/2015   Allergic rhinosinusitis    Anticoagulated on Coumadin 03/24/2019   Asymptomatic bilateral carotid artery stenosis 09/30/2015   Atherosclerosis 01/14/2018   Bilateral carotid bruits 01/18/2018   Bronchial asthma    Chronic diarrhea    Coronary artery disease involving native coronary artery of native heart without angina pectoris 12/04/2019   Depression 01/14/2018   Disease of thyroid gland 01/14/2018   hypothyroidism   Encounter for therapeutic drug level monitoring 05/06/2015   Essential hypertension 05/20/2019   Ex-smoker 01/18/2018   GI bleed 03/16/2015   History of right-sided carotid endarterectomy    Hyperlipidemia    Hypothyroidism    IBS (irritable bowel syndrome)    Incisional hernia 07/19/2016   Long term current use of anticoagulant therapy 05/06/2015   Mild memory disturbance    Mixed hyperlipidemia 05/20/2019   Osteoporosis    Overweight    Peripheral vascular disease ([place]) 05/06/2015   Vitamin D deficiency     Allergies  Allergen Reactions   Brilinta [Ticagrelor] Hives   Codeine     Pt can probably take med, unsure of reaction    Iodinated Contrast Media Hives    Broke out in Hives in abdominal area     ROS: Negative except as per HPI above  Objective:  General: AAO x3, NAD  Dermatological: With inspection and palpation of the right and left lower extremities  there are no open sores, no preulcerative lesions, no rash or signs of infection present. Nails are of normal length thickness and coloration.   Vascular:  Dorsalis Pedis artery and Posterior Tibial artery pedal pulses are 2/4 bilateral.  Capillary fill time < 3 sec to all digits.   Neruologic: Grossly intact via light touch bilateral. Protective threshold intact to all sites bilateral.   Musculoskeletal: With palpation of the plantar right midfoot and arch area there is pain along the course of the flexor hallucis longus tendon.  Pain is increased with forced dorsiflexion of the hallux and plantarflexion along the FHL tendon.  Gait: Unassisted, Nonantalgic.   No images are attached to the encounter.  Radiographs:  Date: 04/07/2022 XR left foot weightbearing AP/Lateral/Oblique   Findings: no fracture, dislocation, swelling or degenerative changes noted Assessment:   1. Tendinitis of right foot   2. Capsulitis of foot, right      Plan:  Patient was evaluated and treated and all questions answered.  #Flexor hallucis longus tendinitis of the right foot -Discussed with the patient I believe she is dealing with a flexor hallucis longus tendinitis of the right foot.  This would explain pain along the course of the flexor hallucis longus tendon on the plantar medial arch area. -Do not feel that she has significant pain in the heel or evidence of planter fasciitis. -I recommend treatment with a steroid  Dosepak methylprednisolone 4 mg take as directed for 6 days. -I also recommend immobilization in a cam boot for the next 3 weeks.  She should wear the boot when ambulating for the next 3 weeks she does not need to wear it while sleeping -After 3 weeks allow her to try to return to normal shoe gear and see how she is feeling.  She will follow-up in 4 weeks for recheck.  Return in about 4 weeks (around 05/05/2022) for Follow up L flexor hallucis longus tendinitis .          Everitt Amber,  DPM [facility]

## 2022-04-10 ENCOUNTER — Ambulatory Visit (HOSPITAL_COMMUNITY)
Admission: RE | Admit: 2022-04-10 | Discharge: 2022-04-10 | Disposition: A | Discharge status: Home / Self Care | Payer: PPO | Source: Ambulatory Visit | Attending: Internal Medicine | Admitting: Internal Medicine

## 2022-04-10 DIAGNOSIS — I2510 Atherosclerotic heart disease of native coronary artery without angina pectoris: Secondary | ICD-10-CM | POA: Diagnosis not present

## 2022-04-10 DIAGNOSIS — E782 Mixed hyperlipidemia: Secondary | ICD-10-CM | POA: Diagnosis not present

## 2022-04-10 DIAGNOSIS — I739 Peripheral vascular disease, unspecified: Secondary | ICD-10-CM | POA: Diagnosis not present

## 2022-04-10 DIAGNOSIS — I10 Essential (primary) hypertension: Secondary | ICD-10-CM | POA: Diagnosis not present

## 2022-04-10 DIAGNOSIS — I6523 Occlusion and stenosis of bilateral carotid arteries: Secondary | ICD-10-CM | POA: Insufficient documentation

## 2022-04-10 DIAGNOSIS — Z87891 Personal history of nicotine dependence: Secondary | ICD-10-CM | POA: Diagnosis not present

## 2022-04-18 DIAGNOSIS — Z7901 Long term (current) use of anticoagulants: Secondary | ICD-10-CM | POA: Diagnosis not present

## 2022-05-01 ENCOUNTER — Other Ambulatory Visit: Payer: Self-pay | Admitting: *Deleted

## 2022-05-01 NOTE — Progress Notes (Signed)
Medication updated

## 2022-05-17 DIAGNOSIS — Z7901 Long term (current) use of anticoagulants: Secondary | ICD-10-CM | POA: Diagnosis not present

## 2022-05-24 ENCOUNTER — Ambulatory Visit: Payer: PPO | Attending: Cardiology | Admitting: Cardiology

## 2022-05-24 VITALS — BP 90/48 | HR 76 | Ht 60.0 in | Wt 134.6 lb

## 2022-05-24 DIAGNOSIS — I2510 Atherosclerotic heart disease of native coronary artery without angina pectoris: Secondary | ICD-10-CM

## 2022-05-24 DIAGNOSIS — E782 Mixed hyperlipidemia: Secondary | ICD-10-CM | POA: Diagnosis not present

## 2022-05-24 DIAGNOSIS — I739 Peripheral vascular disease, unspecified: Secondary | ICD-10-CM | POA: Diagnosis not present

## 2022-05-24 DIAGNOSIS — I10 Essential (primary) hypertension: Secondary | ICD-10-CM

## 2022-05-24 NOTE — Patient Instructions (Signed)
Medication Instructions:  Your physician recommends that you continue on your current medications as directed. Please refer to the Current Medication list given to you today.  *If you need a refill on your cardiac medications before your next appointment, please call your pharmacy*   Lab Work: None ordered If you have labs (blood work) drawn today and your tests are completely normal, you will receive your results only by: MyChart Message (if you have MyChart) OR A paper copy in the mail If you have any lab test that is abnormal or we need to change your treatment, we will call you to review the results.   Testing/Procedures: None ordered   Follow-Up: At [facility], you and your health needs are our priority.  As part of our continuing mission to provide you with exceptional heart care, we have created designated Provider Care Teams.  These Care Teams include your primary Cardiologist (physician) and Advanced Practice Providers (APPs -  Physician Assistants and Nurse Practitioners) who all work together to provide you with the care you need, when you need it.  We recommend signing up for the patient portal called "MyChart".  Sign up information is provided on this After Visit Summary.  MyChart is used to connect with patients for Virtual Visits (Telemedicine).  Patients are able to view lab/test results, encounter notes, upcoming appointments, etc.  Non-urgent messages can be sent to your provider as well.   To learn more about what you can do with MyChart, go to [email or web address].    Your next appointment:   9 month(s)  The format for your next appointment:   In Person  Provider:   Rajan Revankar, MD    Other Instructions none  Important Information About Sugar       

## 2022-05-24 NOTE — Progress Notes (Signed)
Cardiology Office Note:    Date:  05/24/2022   ID:  Lisa Gilbert, DOB Aug 26, 1945, MRN [id number]  PCP:  Nicholos Johns, MD  Cardiologist:  Jenean Lindau, MD   Referring MD: Nicholos Johns, MD    ASSESSMENT:    1. Coronary artery disease involving native coronary artery of native heart without angina pectoris   2. Essential hypertension   3. Peripheral vascular disease (Lisa Gilbert)   4. Mixed hyperlipidemia    PLAN:    In order of problems listed above:  Coronary atherosclerosis and atherosclerotic vascular disease and peripheral vascular disease: Secondary prevention stressed with patient.  Importance of compliance with diet medication stressed and she vocalized understanding.  She was advised to walk to the best of her ability.  Especially with peripheral vascular disease the importance of aerobic exercise was stressed.  She promises to do better. Essential hypertension: Blood pressure is stable and diet was emphasized. Mixed dyslipidemia: On lipid-lowering medications.  She does monitor blood work within the next few days for primary care and she will send [place] a copy.  Goal LDL should be less than 60. Cigarette smoker: I spent 5 minutes with the patient discussing solely about smoking. Smoking cessation was counseled. I suggested to the patient also different medications and pharmacological interventions. Patient is keen to try stopping on its own at this time. He will get back to me if he needs any further assistance in this matter. Patient will be seen in follow-up appointment in 6 months or earlier if the patient has any concerns    Medication Adjustments/Labs and Tests Ordered: Current medicines are reviewed at length with the patient today.  Concerns regarding medicines are outlined above.  No orders of the defined types were placed in this encounter.  No orders of the defined types were placed in this encounter.    No chief complaint on file.    History of Present Illness:     Lisa Gilbert is a 77 y.o. female.  Patient has past medical history of coronary atherosclerosis, essential hypertension, mixed dyslipidemia, peripheral vascular disease and unfortunately continues to smoke.  She denies any chest pain orthopnea or PND.  She takes care of activities of daily living.  She does not exercise on a regular basis.  At the time of my evaluation, the patient is alert awake oriented and in no distress.  Past Medical History:  Diagnosis Date   Abnormal INR 11/30/2015   Allergic rhinosinusitis    Anticoagulated on Coumadin 03/24/2019   Arteriosclerosis of coronary artery 03/16/2015   Asymptomatic bilateral carotid artery stenosis 09/30/2015   Atherosclerosis 01/14/2018   Bilateral carotid bruits 01/18/2018   Bronchial asthma    Chronic diarrhea    Cigarette smoker 11/01/2021   Coronary artery disease involving native coronary artery of native heart without angina pectoris 12/04/2019   Depression 01/14/2018   Disease of thyroid gland 01/14/2018   hypothyroidism   Encounter for therapeutic drug level monitoring 05/06/2015   Essential hypertension 05/20/2019   Ex-smoker 01/18/2018   GI bleed 03/16/2015   History of right-sided carotid endarterectomy    Hyperlipidemia    Hypothyroidism    IBS (irritable bowel syndrome)    Incisional hernia 07/19/2016   Long term current use of anticoagulant therapy 05/06/2015   Mild memory disturbance    Mixed hyperlipidemia 05/20/2019   Osteoporosis    Overweight    Peripheral vascular disease (Lisa Gilbert) 05/06/2015   Vitamin D deficiency     Past Surgical History:  Procedure Laterality Date   ABDOMINAL AORTOGRAM W/LOWER EXTREMITY Bilateral 12/19/2021   Procedure: ABDOMINAL AORTOGRAM W/LOWER EXTREMITY;  Surgeon: Lorretta Harp, MD;  Location: [facility];  Service: Cardiovascular;  Laterality: Bilateral;   CAROTID STENT     CORONARY ANGIOPLASTY WITH STENT PLACEMENT  2015   2 Stents   HERNIA REPAIR  03/31/2019    Dr. Orrin Brigham III MD at [facility]    Current Medications: Current Meds  Medication Sig   albuterol (VENTOLIN HFA) 108 (90 Base) MCG/ACT inhaler Inhale 2 puffs into the lungs as needed for wheezing or shortness of breath.   alendronate (FOSAMAX) 70 MG tablet Take 70 mg by mouth every Saturday.   ALPRAZolam (XANAX) 0.25 MG tablet Take 0.25 mg by mouth daily as needed for anxiety.   aspirin EC 81 MG tablet Take 81 mg by mouth daily.   atorvastatin (LIPITOR) 80 MG tablet Take 80 mg by mouth daily.   buPROPion (WELLBUTRIN XL) 150 MG 24 hr tablet Take 150 mg by mouth daily.   calcium carbonate (OS-CAL) 600 MG tablet Take 600 mg by mouth daily.   cilostazol (PLETAL) 100 MG tablet Take 1 tablet (100 mg total) by mouth 2 (two) times daily.   diclofenac Sodium (VOLTAREN) 1 % GEL Apply 1 Application topically daily as needed (pain).   donepezil (ARICEPT) 10 MG tablet Take 10 mg by mouth daily.   fluticasone (FLONASE) 50 MCG/ACT nasal spray Place 2 sprays into both nostrils daily as needed for allergies.   levothyroxine (SYNTHROID, LEVOTHROID) 25 MCG tablet Take 25 mcg by mouth daily before breakfast.   lisinopril (PRINIVIL,ZESTRIL) 5 MG tablet Take 5 mg by mouth daily.   loratadine (CLARITIN) 10 MG tablet Take 10 mg by mouth as needed for allergies or rhinitis.   montelukast (SINGULAIR) 10 MG tablet Take 10 mg by mouth at bedtime.   pantoprazole (PROTONIX) 40 MG tablet Take 40 mg by mouth daily.   potassium chloride SA (KLOR-CON M) 20 MEQ tablet Take 1 tablet (20 mEq total) by mouth as needed. Patient must keep appointment for 11/01/21 for further refills. 3 rd/final attempt   Probiotic Product (PROBIOTIC DAILY PO) Take 1 capsule by mouth daily.   sertraline (ZOLOFT) 50 MG tablet Take 50 mg by mouth daily.   Vitamin D, Ergocalciferol, (DRISDOL) 50000 units CAPS capsule Take 50,000 Units by mouth every Saturday.   warfarin (COUMADIN) 5 MG tablet Take 1-1.5 tablets (5-7.5 mg total) by mouth See admin  instructions. 5 mg daily Except takes 7.'5mg'$  on Tuesday and Thursday     Allergies:   Brilinta [ticagrelor], Codeine, and Iodinated contrast media   Social History   Socioeconomic History   Marital status: Married    Spouse name: Not on file   Number of children: Not on file   Years of education: Not on file   Highest education level: Not on file  Occupational History   Not on file  Tobacco Use   Smoking status: Former   Smokeless tobacco: Never  Vaping Use   Vaping Use: Never used  Substance and Sexual Activity   Alcohol use: Not on file   Drug use: Yes    Types: PCP   Sexual activity: Not on file  Other Topics Concern   Not on file  Social History Narrative   Not on file   Social Determinants of Health   Financial Resource Strain: Not on file  Food Insecurity: Not on file  Transportation Needs: Not on file  Physical Activity: Not on file  Stress: Not on file  Social Connections: Not on file     Family History: The patient's family history includes Breast cancer in her mother; Diabetes in her brother and sister; Heart Problems in her brother and sister; Hypertension in her mother; Kidney cancer in her father.  ROS:   Please see the history of present illness.    All other systems reviewed and are negative.  EKGs/Labs/Other Studies Reviewed:    The following studies were reviewed today: I discussed my findings with the patient at length.   Recent Labs: 11/30/2021: BUN 13; Creatinine, Ser 1.00; Hemoglobin 13.9; Platelets 274; Potassium 4.5; Sodium 139  Recent Lipid Panel No results found for: "CHOL", "TRIG", "HDL", "CHOLHDL", "VLDL", "LDLCALC", "LDLDIRECT"  Physical Exam:    VS:  BP (!) 90/48   Pulse 76   Ht 5' (1.524 m)   Wt 134 lb 9.6 oz (61.1 kg)   SpO2 99%   BMI 26.29 kg/m     Wt Readings from Last 3 Encounters:  05/24/22 134 lb 9.6 oz (61.1 kg)  04/05/22 132 lb (59.9 kg)  01/24/22 134 lb (60.8 kg)     GEN: Patient is in no acute  distress HEENT: Normal NECK: No JVD; No carotid bruits LYMPHATICS: No lymphadenopathy CARDIAC: Hear sounds regular, 2/6 systolic murmur at the apex. RESPIRATORY:  Clear to auscultation without rales, wheezing or rhonchi  ABDOMEN: Soft, non-tender, non-distended MUSCULOSKELETAL:  No edema; No deformity  SKIN: Warm and dry NEUROLOGIC:  Alert and oriented x 3 PSYCHIATRIC:  Normal affect   Signed, Jenean Lindau, MD  05/24/2022 3:58 PM    [facility]

## 2022-06-07 ENCOUNTER — Other Ambulatory Visit (HOSPITAL_BASED_OUTPATIENT_CLINIC_OR_DEPARTMENT_OTHER): Payer: Self-pay | Admitting: Cardiovascular Disease

## 2022-06-07 DIAGNOSIS — I779 Disorder of arteries and arterioles, unspecified: Secondary | ICD-10-CM

## 2022-06-13 DIAGNOSIS — E785 Hyperlipidemia, unspecified: Secondary | ICD-10-CM | POA: Diagnosis not present

## 2022-06-13 DIAGNOSIS — Z6826 Body mass index (BMI) 26.0-26.9, adult: Secondary | ICD-10-CM | POA: Diagnosis not present

## 2022-06-13 DIAGNOSIS — K219 Gastro-esophageal reflux disease without esophagitis: Secondary | ICD-10-CM | POA: Diagnosis not present

## 2022-06-13 DIAGNOSIS — R413 Other amnesia: Secondary | ICD-10-CM | POA: Diagnosis not present

## 2022-06-13 DIAGNOSIS — K589 Irritable bowel syndrome without diarrhea: Secondary | ICD-10-CM | POA: Diagnosis not present

## 2022-06-13 DIAGNOSIS — E039 Hypothyroidism, unspecified: Secondary | ICD-10-CM | POA: Diagnosis not present

## 2022-06-13 DIAGNOSIS — I739 Peripheral vascular disease, unspecified: Secondary | ICD-10-CM | POA: Diagnosis not present

## 2022-06-13 DIAGNOSIS — I2510 Atherosclerotic heart disease of native coronary artery without angina pectoris: Secondary | ICD-10-CM | POA: Diagnosis not present

## 2022-06-13 DIAGNOSIS — Z7901 Long term (current) use of anticoagulants: Secondary | ICD-10-CM | POA: Diagnosis not present

## 2022-06-13 DIAGNOSIS — I10 Essential (primary) hypertension: Secondary | ICD-10-CM | POA: Diagnosis not present

## 2022-06-20 DIAGNOSIS — E785 Hyperlipidemia, unspecified: Secondary | ICD-10-CM | POA: Diagnosis not present

## 2022-06-20 DIAGNOSIS — E559 Vitamin D deficiency, unspecified: Secondary | ICD-10-CM | POA: Diagnosis not present

## 2022-06-20 DIAGNOSIS — E039 Hypothyroidism, unspecified: Secondary | ICD-10-CM | POA: Diagnosis not present

## 2022-06-20 DIAGNOSIS — Z7901 Long term (current) use of anticoagulants: Secondary | ICD-10-CM | POA: Diagnosis not present

## 2022-06-20 DIAGNOSIS — Z79899 Other long term (current) drug therapy: Secondary | ICD-10-CM | POA: Diagnosis not present

## 2022-07-01 DIAGNOSIS — L820 Inflamed seborrheic keratosis: Secondary | ICD-10-CM | POA: Diagnosis not present

## 2022-07-18 DIAGNOSIS — R791 Abnormal coagulation profile: Secondary | ICD-10-CM | POA: Diagnosis not present

## 2022-08-15 DIAGNOSIS — R791 Abnormal coagulation profile: Secondary | ICD-10-CM | POA: Diagnosis not present

## 2022-08-22 ENCOUNTER — Encounter: Payer: Self-pay | Admitting: Vascular Surgery

## 2022-08-22 ENCOUNTER — Ambulatory Visit (HOSPITAL_COMMUNITY)
Admission: RE | Admit: 2022-08-22 | Discharge: 2022-08-22 | Disposition: A | Discharge status: Home / Self Care | Payer: PPO | Source: Ambulatory Visit | Attending: Vascular Surgery | Admitting: Vascular Surgery

## 2022-08-22 ENCOUNTER — Ambulatory Visit: Payer: PPO | Admitting: Vascular Surgery

## 2022-08-22 VITALS — BP 105/67 | HR 58 | Temp 97.4°F | Resp 14 | Ht 60.0 in | Wt 132.0 lb

## 2022-08-22 DIAGNOSIS — I739 Peripheral vascular disease, unspecified: Secondary | ICD-10-CM | POA: Diagnosis not present

## 2022-08-22 DIAGNOSIS — Q251 Coarctation of aorta: Secondary | ICD-10-CM | POA: Insufficient documentation

## 2022-08-22 DIAGNOSIS — Z95828 Presence of other vascular implants and grafts: Secondary | ICD-10-CM | POA: Insufficient documentation

## 2022-08-22 LAB — VAS US ABI WITH/WO TBI
Left ABI: 0.64
Right ABI: 0.93

## 2022-08-22 NOTE — Progress Notes (Signed)
Patient name: Lisa Gilbert MRN: [id number] DOB: 12-Jul-1945 Sex: female  REASON FOR CONSULT: 6 month follow-up, recurrent left lower extremity claudication  HPI: Lisa Gilbert is a 77 y.o. female, with history of hypertension, hyperlipidemia, right carotid stent at [facility] by Dr. Maryjean Morn, tobacco abuse, peripheral vascular disease, coronary artery disease status post MI that presents  for interval 6 month follow-up of her left leg claudication and previously referred by Dr. Gwenlyn Found.  Today she states her left leg is doing pretty well.  She is able to do almost all of her activities of daily living including walking and gardening.  She has had no worsening symptoms.  She had a complex history and underwent bilateral common iliac artery and left external iliac artery stenting by Dr. Maryjean Morn in [facility] and then had significant postop bleed requiring left common femoral endarterectomy with profundoplasty around 2015.  At the time she was having disabling claudication in the left leg.  She has had recurrent symptoms.  Her ABIs on 11/30/2021 were 0.77 on the right 0.5 on the left.  She had a CTA on 12/05/2021 showing a 75% distal abdominal aortic stenosis as well as diseased left common femoral artery with occluded SFA and dominant profunda runoff.  She has also recently undergone angiogram with Dr. Gwenlyn Found.    Past Medical History:  Diagnosis Date   Abnormal INR 11/30/2015   Allergic rhinosinusitis    Anticoagulated on Coumadin 03/24/2019   Arteriosclerosis of coronary artery 03/16/2015   Asymptomatic bilateral carotid artery stenosis 09/30/2015   Atherosclerosis 01/14/2018   Bilateral carotid bruits 01/18/2018   Bronchial asthma    Chronic diarrhea    Cigarette smoker 11/01/2021   Coronary artery disease involving native coronary artery of native heart without angina pectoris 12/04/2019   Depression 01/14/2018   Disease of thyroid gland 01/14/2018   hypothyroidism   Encounter for therapeutic  drug level monitoring 05/06/2015   Essential hypertension 05/20/2019   Ex-smoker 01/18/2018   GI bleed 03/16/2015   History of right-sided carotid endarterectomy    Hyperlipidemia    Hypothyroidism    IBS (irritable bowel syndrome)    Incisional hernia 07/19/2016   Long term current use of anticoagulant therapy 05/06/2015   Mild memory disturbance    Mixed hyperlipidemia 05/20/2019   Osteoporosis    Overweight    Peripheral vascular disease 05/06/2015   Vitamin D deficiency     Past Surgical History:  Procedure Laterality Date   ABDOMINAL AORTOGRAM W/LOWER EXTREMITY Bilateral 12/19/2021   Procedure: ABDOMINAL AORTOGRAM W/LOWER EXTREMITY;  Surgeon: Lorretta Harp, MD;  Location: [facility];  Service: Cardiovascular;  Laterality: Bilateral;   CAROTID STENT     CORONARY ANGIOPLASTY WITH STENT PLACEMENT  2015   2 Stents   HERNIA REPAIR  03/31/2019   Dr. Orrin Brigham III MD at [facility]    Family History  Problem Relation Age of Onset   Hypertension Mother    Breast cancer Mother    Kidney cancer Father    Diabetes Sister    Heart Problems Sister    Diabetes Brother    Heart Problems Brother     SOCIAL HISTORY: Social History   Socioeconomic History   Marital status: Married    Spouse name: Not on file   Number of children: Not on file   Years of education: Not on file   Highest education level: Not on file  Occupational History   Not on file  Tobacco Use  Smoking status: Former   Smokeless tobacco: Never  Scientific laboratory technician Use: Never used  Substance and Sexual Activity   Alcohol use: Not on file   Drug use: Yes    Types: PCP   Sexual activity: Not on file  Other Topics Concern   Not on file  Social History Narrative   Not on file   Social Determinants of Health   Financial Resource Strain: Not on file  Food Insecurity: Not on file  Transportation Needs: Not on file  Physical Activity: Not on file  Stress: Not on file  Social Connections: Not  on file  Intimate Partner Violence: Not on file    Allergies  Allergen Reactions   Brilinta [Ticagrelor] Hives   Codeine     Pt can probably take med, unsure of reaction    Iodinated Contrast Media Hives    Broke out in Hives in abdominal area     Current Outpatient Medications  Medication Sig Dispense Refill   albuterol (VENTOLIN HFA) 108 (90 Base) MCG/ACT inhaler Inhale 2 puffs into the lungs as needed for wheezing or shortness of breath.     alendronate (FOSAMAX) 70 MG tablet Take 70 mg by mouth every Saturday.     ALPRAZolam (XANAX) 0.25 MG tablet Take 0.25 mg by mouth daily as needed for anxiety.     aspirin EC 81 MG tablet Take 81 mg by mouth daily.     atorvastatin (LIPITOR) 80 MG tablet Take 80 mg by mouth daily.     buPROPion (WELLBUTRIN XL) 150 MG 24 hr tablet Take 150 mg by mouth daily.     calcium carbonate (OS-CAL) 600 MG tablet Take 600 mg by mouth daily.     cilostazol (PLETAL) 100 MG tablet Take 1 tablet (100 mg total) by mouth 2 (two) times daily. 180 tablet 1   diclofenac Sodium (VOLTAREN) 1 % GEL Apply 1 Application topically daily as needed (pain).     donepezil (ARICEPT) 10 MG tablet Take 10 mg by mouth daily.     fluticasone (FLONASE) 50 MCG/ACT nasal spray Place 2 sprays into both nostrils daily as needed for allergies.     levothyroxine (SYNTHROID, LEVOTHROID) 25 MCG tablet Take 25 mcg by mouth daily before breakfast.     lisinopril (PRINIVIL,ZESTRIL) 5 MG tablet Take 5 mg by mouth daily.     loratadine (CLARITIN) 10 MG tablet Take 10 mg by mouth as needed for allergies or rhinitis.     montelukast (SINGULAIR) 10 MG tablet Take 10 mg by mouth at bedtime.     pantoprazole (PROTONIX) 40 MG tablet Take 40 mg by mouth daily.     potassium chloride SA (KLOR-CON M) 20 MEQ tablet Take 1 tablet (20 mEq total) by mouth as needed. Patient must keep appointment for 11/01/21 for further refills. 3 rd/final attempt 56 tablet 0   Probiotic Product (PROBIOTIC DAILY PO) Take 1  capsule by mouth daily.     sertraline (ZOLOFT) 50 MG tablet Take 50 mg by mouth daily.     Vitamin D, Ergocalciferol, (DRISDOL) 50000 units CAPS capsule Take 50,000 Units by mouth every Saturday.     warfarin (COUMADIN) 5 MG tablet Take 1-1.5 tablets (5-7.5 mg total) by mouth See admin instructions. 5 mg daily Except takes 7.5mg  on Tuesday and Thursday 135 tablet 1   No current facility-administered medications for this visit.    REVIEW OF SYSTEMS:  [X]  denotes positive finding, [ ]  denotes negative finding Cardiac  Comments:  Chest pain or chest pressure:    Shortness of breath upon exertion:    Short of breath when lying flat:    Irregular heart rhythm:        Vascular    Pain in calf, thigh, or hip brought on by ambulation: x left  Pain in feet at night that wakes you up from your sleep:     Blood clot in your veins:    Leg swelling:         Pulmonary    Oxygen at home:    Productive cough:     Wheezing:         Neurologic    Sudden weakness in arms or legs:     Sudden numbness in arms or legs:     Sudden onset of difficulty speaking or slurred speech:    Temporary loss of vision in one eye:     Problems with dizziness:         Gastrointestinal    Blood in stool:     Vomited blood:         Genitourinary    Burning when urinating:     Blood in urine:        Psychiatric    Major depression:         Hematologic    Bleeding problems:    Problems with blood clotting too easily:        Skin    Rashes or ulcers:        Constitutional    Fever or chills:      PHYSICAL EXAM: Vitals:   08/22/22 1227  BP: 105/67  Pulse: (!) 58  Resp: 14  Temp: (!) 97.4 F (36.3 C)  TempSrc: Temporal  SpO2: 94%  Weight: 132 lb (59.9 kg)  Height: 5' (1.524 m)    GENERAL: The patient is a well-nourished female, in no acute distress. The vital signs are documented above. CARDIAC: There is a regular rate and rhythm.  VASCULAR:  1+ palpable femoral pulses bilaterally No  palpable pedal pulses but no tissue loss PULMONARY: No respiratory distress. ABDOMEN: Soft and non-tender. MUSCULOSKELETAL: There are no major deformities or cyanosis. NEUROLOGIC: No focal weakness or paresthesias are detected. SKIN: There are no ulcers or rashes noted. PSYCHIATRIC: The patient has a normal affect.  DATA:   ABIs today are 0.93 right biphasic and 0.64 left monophasic  Assessment/Plan:  77 y.o. female, with history of hypertension, hyperlipidemia, right carotid stent at [facility] by Dr. Maryjean Morn, tobacco abuse, peripheral vascular disease, coronary artery disease status post MI that presents for 6 month follow-up of her  left leg claudication as a referral from Dr. Gwenlyn Found.  She had a complex history and underwent bilateral common iliac artery and left external iliac artery stenting by Dr. Maryjean Morn in [facility] and then had significant postop bleed requiring left common femoral endarterectomy with profundoplasty around 2015.  I re-reviewed her CTA as well as arteriogram imaging from Dr. Gwenlyn Found.  She has a high-grade stenosis of her distal abdominal aorta as well as a very diseased high-grade stenosis with a diminutive left common femoral artery with dominant profunda runoff and an occluded SFA.   My concern is a hybrid endovascular approach with redo left femoral endarterectomy with retrograde stenting of her abdominal aorta would likely have very poor durability especially with her still smoking.  Fortunately her claudication is not disabling.  Her symptoms are very stable.  No critical limb ischemia like rest pain or  tissue loss.  We will continue to monitor.  I will see her in 1 year with ABIs.  I discussed she call me with questions or concerns or worsening symptoms.  Discussed the importance of walking and exercise therapy for collateral formation.   Marty Heck, MD [facility] Office: [phone]

## 2022-09-12 DIAGNOSIS — R791 Abnormal coagulation profile: Secondary | ICD-10-CM | POA: Diagnosis not present

## 2022-10-04 ENCOUNTER — Encounter: Payer: Self-pay | Admitting: Cardiovascular Disease

## 2022-10-04 ENCOUNTER — Ambulatory Visit: Payer: PPO | Attending: Cardiovascular Disease | Admitting: Cardiovascular Disease

## 2022-10-04 VITALS — BP 122/50 | HR 75 | Ht 60.0 in | Wt 134.4 lb

## 2022-10-04 DIAGNOSIS — I739 Peripheral vascular disease, unspecified: Secondary | ICD-10-CM

## 2022-10-04 NOTE — Progress Notes (Signed)
10/04/2022 Lisa Gilbert   Jun 22, 1945  [id number]  Primary Physician Lucianne Lei, MD Primary Cardiologist: Runell Gess MD Nicholes Calamity, [place]  HPI:  Lisa Gilbert is a 77 y.o.  mildly overweight widowed Caucasian female mother of 2, grandmother of 3 grandchildren referred to me by Dr. Tomie China, her cardiologist, for peripheral vascular evaluation.  She is retired from working at [employer] where she worked for 30 years.  I last saw her in the office 04/05/2022.  She is accompanied by her daughter Amy today.  Risk factors include over 50 pack years of tobacco abuse currently smoking 1 pack/day, treated hypertension and hyperlipidemia.  Apparently her brothers had ischemic heart disease as well.  She had a myocardial infarction in 2015 and had 2 stents placed in her heart at that time in [facility].  She is also had right internal carotid artery stenting.  As well has bilateral lower extremity stents by Dr. Sondra Come, vascular surgeon, apparently there was a complication during stent placement requiring emergency surgery.  She has been on Coumadin anticoagulation since for unclear reasons.  There is no history of PAF or DVT that I can determine.  She does complain of left calf claudication.  She had lower extremity arterial Doppler studies performed 11/30/2021 revealing a right ABI of 0.77 and a left of 0.58.  She had high-frequency signals in both iliac arteries as well as in her left common femoral artery.  She did state that after her intervention which was done in the outpatient setting she had what sounds like a perforation in her iliac artery above the left iliac stent requiring urgent transfer to [facility] regional hospital where she was placed in the ICU, intubated and transfused.  She also had surgery on her right groin as well.  Her claudication resolved after that however, over the last several months she developed left calf claudication.   I performed abdominal aortography  with bifemoral runoff 12/19/2021 revealing 90% distal abdominal aortic stenosis with a 40 mm gradient, patent bilateral iliac stents, 80 to 90% bilateral common femoral artery stenoses with a total left SFA reconstituting in the adductor canal with two-vessel runoff.  She is symptomatic on the left side.  I suspect she will need left common femoral endarterectomy with patch angioplasty, left femoropopliteal bypass grafting and distal abdominal aortic stenting.    I sent her to Dr. Clotilde Dieter, vascular surgeon for consideration of surgical revascularization.  Options include aortobifemoral bypass grafting versus hybrid procedure with left common femoral endarterectomy/patch angioplasty with retrograde abdominal aortic stenting.  He preferred conservative approach now since she is still smoking.  She denies chest pain or shortness of breath.  I do not see a reason for her to be on Coumadin and therefore recommended that she discontinue this.  Since I saw her 6 months ago she continues to see Dr. Chestine Spore who she recently saw last month.  Her claudication does not appear to be lifestyle limiting.  She does continue to smoke.  We continue to follow her conservatively.     Current Meds  Medication Sig   albuterol (VENTOLIN HFA) 108 (90 Base) MCG/ACT inhaler Inhale 2 puffs into the lungs as needed for wheezing or shortness of breath.   alendronate (FOSAMAX) 70 MG tablet Take 70 mg by mouth every Saturday.   ALPRAZolam (XANAX) 0.25 MG tablet Take 0.25 mg by mouth daily as needed for anxiety.   aspirin EC 81 MG tablet Take 81 mg  by mouth daily.   atorvastatin (LIPITOR) 80 MG tablet Take 80 mg by mouth daily.   Biotin 1 MG CAPS Take by mouth.   buPROPion (WELLBUTRIN XL) 150 MG 24 hr tablet Take 150 mg by mouth daily.   calcium carbonate (OS-CAL) 600 MG tablet Take 600 mg by mouth daily.   cilostazol (PLETAL) 100 MG tablet Take 1 tablet (100 mg total) by mouth 2 (two) times daily.   diclofenac Sodium  (VOLTAREN) 1 % GEL Apply 1 Application topically daily as needed (pain).   dicyclomine (BENTYL) 10 MG capsule Take 1 capsule by mouth 2 (two) times daily.   donepezil (ARICEPT) 10 MG tablet Take 10 mg by mouth daily.   fluticasone (FLONASE) 50 MCG/ACT nasal spray Place 2 sprays into both nostrils daily as needed for allergies.   levothyroxine (SYNTHROID, LEVOTHROID) 25 MCG tablet Take 25 mcg by mouth daily before breakfast.   lisinopril (PRINIVIL,ZESTRIL) 5 MG tablet Take 5 mg by mouth daily.   loratadine (CLARITIN) 10 MG tablet Take 10 mg by mouth as needed for allergies or rhinitis.   montelukast (SINGULAIR) 10 MG tablet Take 10 mg by mouth at bedtime.   nitroGLYCERIN (NITROSTAT) 0.4 MG SL tablet Place 0.4 mg under the tongue every 5 (five) minutes as needed for chest pain.   pantoprazole (PROTONIX) 40 MG tablet Take 40 mg by mouth daily.   potassium chloride SA (KLOR-CON M) 20 MEQ tablet Take 1 tablet (20 mEq total) by mouth as needed. Patient must keep appointment for 11/01/21 for further refills. 3 rd/final attempt   Probiotic Product (PROBIOTIC DAILY PO) Take 1 capsule by mouth daily.   sertraline (ZOLOFT) 50 MG tablet Take 50 mg by mouth daily.   Vitamin D, Ergocalciferol, (DRISDOL) 50000 units CAPS capsule Take 50,000 Units by mouth every Saturday.   warfarin (COUMADIN) 5 MG tablet Take 1-1.5 tablets (5-7.5 mg total) by mouth See admin instructions. 5 mg daily Except takes 7.5mg  on Tuesday and Thursday     Allergies  Allergen Reactions   Brilinta [Ticagrelor] Hives   Codeine     Pt can probably take med, unsure of reaction    Iodinated Contrast Media Hives    Broke out in Hives in abdominal area     Social History   Socioeconomic History   Marital status: Married    Spouse name: Not on file   Number of children: Not on file   Years of education: Not on file   Highest education level: Not on file  Occupational History   Not on file  Tobacco Use   Smoking status: Former    Smokeless tobacco: Never  Vaping Use   Vaping Use: Never used  Substance and Sexual Activity   Alcohol use: Not on file   Drug use: Yes    Types: PCP   Sexual activity: Not on file  Other Topics Concern   Not on file  Social History Narrative   Not on file   Social Determinants of Health   Financial Resource Strain: Not on file  Food Insecurity: Not on file  Transportation Needs: Not on file  Physical Activity: Not on file  Stress: Not on file  Social Connections: Not on file  Intimate Partner Violence: Not on file     Review of Systems: General: negative for chills, fever, night sweats or weight changes.  Cardiovascular: negative for chest pain, dyspnea on exertion, edema, orthopnea, palpitations, paroxysmal nocturnal dyspnea or shortness of breath Dermatological: negative for rash Respiratory:  negative for cough or wheezing Urologic: negative for hematuria Abdominal: negative for nausea, vomiting, diarrhea, bright red blood per rectum, melena, or hematemesis Neurologic: negative for visual changes, syncope, or dizziness All other systems reviewed and are otherwise negative except as noted above.    Blood pressure (!) 122/50, pulse 75, height 5' (1.524 m), weight 134 lb 6.4 oz (61 kg), SpO2 96 %.  General appearance: alert and no distress Neck: no adenopathy, no JVD, supple, symmetrical, trachea midline, thyroid not enlarged, symmetric, no tenderness/mass/nodules, and bilateral carotid bruits Lungs: clear to auscultation bilaterally Heart: regular rate and rhythm, S1, S2 normal, no murmur, click, rub or gallop Extremities: extremities normal, atraumatic, no cyanosis or edema Pulses: Diminished pedal pulses bilaterally Skin: Skin color, texture, turgor normal. No rashes or lesions Neurologic: Grossly normal  EKG sinus rhythm at 75 with low limb voltage.  Personally reviewed this EKG.  Patient  ASSESSMENT AND PLAN:   Peripheral vascular disease (HCC) Ms. Chilton Si  returns today for follow-up of her PAD.  She was initially referred to me by Dr. Tomie China, her cardiologist, I last saw her in the office 04/05/2022.  She had undergone complex PD intervention by Dr. Sondra Come remotely which was complicated by dissection and bleeding requiring ICU hospitalization.  She had Dopplers that were performed 11/30/2021 revealing right ABI of 0.77 and left of 0.5 with high-frequency signals in both iliac arteries.  I performed abdominal aortography with bifemoral runoff 12/19/2021 revealing a 90% distal abdominal aortic stenosis with a 40 mm gradient, patent bilateral iliac stents, 80 to 90% bilateral common femoral artery stenoses with a total left SFA reconstituting in the adductor canal with two-vessel runoff.  I referred her to Dr. Clotilde Dieter, vascular surgeon for consideration of surgical revascularization however he felt that she was too high risk candidate given her continued tobacco abuse.  Her claudication really now is not lifestyle limiting and we are treating her medically.     Runell Gess MD FACP,FACC,FAHA, [facility] 10/04/2022 10:22 AM

## 2022-10-04 NOTE — Assessment & Plan Note (Signed)
Lisa Gilbert returns today for follow-up of her PAD.  She was initially referred to me by Dr. Tomie China, her cardiologist, I last saw her in the office 04/05/2022.  She had undergone complex PD intervention by Dr. Sondra Come remotely which was complicated by dissection and bleeding requiring ICU hospitalization.  She had Dopplers that were performed 11/30/2021 revealing right ABI of 0.77 and left of 0.5 with high-frequency signals in both iliac arteries.  I performed abdominal aortography with bifemoral runoff 12/19/2021 revealing a 90% distal abdominal aortic stenosis with a 40 mm gradient, patent bilateral iliac stents, 80 to 90% bilateral common femoral artery stenoses with a total left SFA reconstituting in the adductor canal with two-vessel runoff.  I referred her to Dr. Clotilde Dieter, vascular surgeon for consideration of surgical revascularization however he felt that she was too high risk candidate given her continued tobacco abuse.  Her claudication really now is not lifestyle limiting and we are treating her medically.

## 2022-10-04 NOTE — Patient Instructions (Signed)
Medication Instructions:  Your physician recommends that you continue on your current medications as directed. Please refer to the Current Medication list given to you today.  *If you need a refill on your cardiac medications before your next appointment, please call your pharmacy*   Follow-Up: At [facility], you and your health needs are our priority.  As part of our continuing mission to provide you with exceptional heart care, we have created designated Provider Care Teams.  These Care Teams include your primary Cardiologist (physician) and Advanced Practice Providers (APPs -  Physician Assistants and Nurse Practitioners) who all work together to provide you with the care you need, when you need it.  We recommend signing up for the patient portal called "MyChart".  Sign up information is provided on this After Visit Summary.  MyChart is used to connect with patients for Virtual Visits (Telemedicine).  Patients are able to view lab/test results, encounter notes, upcoming appointments, etc.  Non-urgent messages can be sent to your provider as well.   To learn more about what you can do with MyChart, go to [email or web address].    Your next appointment:   12 month(s)  Provider:   Jonathan Berry, MD    

## 2022-10-24 DIAGNOSIS — Z7901 Long term (current) use of anticoagulants: Secondary | ICD-10-CM | POA: Diagnosis not present

## 2022-11-21 DIAGNOSIS — Z7901 Long term (current) use of anticoagulants: Secondary | ICD-10-CM | POA: Diagnosis not present

## 2022-12-21 DIAGNOSIS — Z7901 Long term (current) use of anticoagulants: Secondary | ICD-10-CM | POA: Diagnosis not present

## 2023-01-07 DIAGNOSIS — S0501XA Injury of conjunctiva and corneal abrasion without foreign body, right eye, initial encounter: Secondary | ICD-10-CM | POA: Diagnosis not present

## 2023-01-10 DIAGNOSIS — H1013 Acute atopic conjunctivitis, bilateral: Secondary | ICD-10-CM | POA: Diagnosis not present

## 2023-02-01 DIAGNOSIS — I4891 Unspecified atrial fibrillation: Secondary | ICD-10-CM | POA: Diagnosis not present

## 2023-02-01 DIAGNOSIS — F17210 Nicotine dependence, cigarettes, uncomplicated: Secondary | ICD-10-CM | POA: Diagnosis not present

## 2023-02-01 DIAGNOSIS — E785 Hyperlipidemia, unspecified: Secondary | ICD-10-CM | POA: Diagnosis not present

## 2023-02-01 DIAGNOSIS — F3342 Major depressive disorder, recurrent, in full remission: Secondary | ICD-10-CM | POA: Diagnosis not present

## 2023-02-01 DIAGNOSIS — F0283 Dementia in other diseases classified elsewhere, unspecified severity, with mood disturbance: Secondary | ICD-10-CM | POA: Diagnosis not present

## 2023-02-01 DIAGNOSIS — F419 Anxiety disorder, unspecified: Secondary | ICD-10-CM | POA: Diagnosis not present

## 2023-02-01 DIAGNOSIS — I739 Peripheral vascular disease, unspecified: Secondary | ICD-10-CM | POA: Diagnosis not present

## 2023-02-01 DIAGNOSIS — E559 Vitamin D deficiency, unspecified: Secondary | ICD-10-CM | POA: Diagnosis not present

## 2023-02-01 DIAGNOSIS — E663 Overweight: Secondary | ICD-10-CM | POA: Diagnosis not present

## 2023-02-01 DIAGNOSIS — E039 Hypothyroidism, unspecified: Secondary | ICD-10-CM | POA: Diagnosis not present

## 2023-02-01 DIAGNOSIS — D6869 Other thrombophilia: Secondary | ICD-10-CM | POA: Diagnosis not present

## 2023-02-01 DIAGNOSIS — F0284 Dementia in other diseases classified elsewhere, unspecified severity, with anxiety: Secondary | ICD-10-CM | POA: Diagnosis not present

## 2023-02-05 DIAGNOSIS — Z7901 Long term (current) use of anticoagulants: Secondary | ICD-10-CM | POA: Diagnosis not present

## 2023-02-07 DIAGNOSIS — Z9181 History of falling: Secondary | ICD-10-CM | POA: Diagnosis not present

## 2023-02-07 DIAGNOSIS — Z0000 Encounter for general adult medical examination without abnormal findings: Secondary | ICD-10-CM | POA: Diagnosis not present

## 2023-02-12 ENCOUNTER — Telehealth: Payer: Self-pay | Admitting: Cardiology

## 2023-02-12 DIAGNOSIS — R791 Abnormal coagulation profile: Secondary | ICD-10-CM | POA: Diagnosis not present

## 2023-02-12 NOTE — Telephone Encounter (Signed)
Lvm that we do not manage coumadin for pt.

## 2023-02-12 NOTE — Telephone Encounter (Signed)
Calling to get patient correct INR goal for patient. Please advise

## 2023-02-19 DIAGNOSIS — R791 Abnormal coagulation profile: Secondary | ICD-10-CM | POA: Diagnosis not present

## 2023-02-26 DIAGNOSIS — R791 Abnormal coagulation profile: Secondary | ICD-10-CM | POA: Diagnosis not present

## 2023-02-26 DIAGNOSIS — Z23 Encounter for immunization: Secondary | ICD-10-CM | POA: Diagnosis not present

## 2023-03-22 DIAGNOSIS — Z961 Presence of intraocular lens: Secondary | ICD-10-CM | POA: Diagnosis not present

## 2023-03-29 DIAGNOSIS — R791 Abnormal coagulation profile: Secondary | ICD-10-CM | POA: Diagnosis not present

## 2023-04-11 ENCOUNTER — Ambulatory Visit (HOSPITAL_COMMUNITY)
Admission: RE | Admit: 2023-04-11 | Discharge: 2023-04-11 | Disposition: A | Discharge status: Home / Self Care | Payer: PPO | Source: Ambulatory Visit | Attending: Cardiovascular Disease | Admitting: Cardiovascular Disease

## 2023-04-11 DIAGNOSIS — I779 Disorder of arteries and arterioles, unspecified: Secondary | ICD-10-CM

## 2023-04-13 ENCOUNTER — Other Ambulatory Visit (HOSPITAL_COMMUNITY): Payer: Self-pay

## 2023-04-13 DIAGNOSIS — I6523 Occlusion and stenosis of bilateral carotid arteries: Secondary | ICD-10-CM

## 2023-04-13 DIAGNOSIS — I779 Disorder of arteries and arterioles, unspecified: Secondary | ICD-10-CM

## 2023-04-26 DIAGNOSIS — R791 Abnormal coagulation profile: Secondary | ICD-10-CM | POA: Diagnosis not present

## 2023-05-28 DIAGNOSIS — R791 Abnormal coagulation profile: Secondary | ICD-10-CM | POA: Diagnosis not present

## 2023-06-25 DIAGNOSIS — M25561 Pain in right knee: Secondary | ICD-10-CM | POA: Diagnosis not present

## 2023-06-28 DIAGNOSIS — R791 Abnormal coagulation profile: Secondary | ICD-10-CM | POA: Diagnosis not present

## 2023-07-05 DIAGNOSIS — R791 Abnormal coagulation profile: Secondary | ICD-10-CM | POA: Diagnosis not present

## 2023-07-19 DIAGNOSIS — R791 Abnormal coagulation profile: Secondary | ICD-10-CM | POA: Diagnosis not present

## 2023-07-24 DIAGNOSIS — R791 Abnormal coagulation profile: Secondary | ICD-10-CM | POA: Diagnosis not present

## 2023-08-08 DIAGNOSIS — M11261 Other chondrocalcinosis, right knee: Secondary | ICD-10-CM | POA: Diagnosis not present

## 2023-08-08 DIAGNOSIS — M25561 Pain in right knee: Secondary | ICD-10-CM | POA: Diagnosis not present

## 2023-08-21 DIAGNOSIS — R791 Abnormal coagulation profile: Secondary | ICD-10-CM | POA: Diagnosis not present

## 2023-09-20 DIAGNOSIS — R791 Abnormal coagulation profile: Secondary | ICD-10-CM | POA: Diagnosis not present

## 2023-10-02 DIAGNOSIS — Z6825 Body mass index (BMI) 25.0-25.9, adult: Secondary | ICD-10-CM | POA: Diagnosis not present

## 2023-10-02 DIAGNOSIS — R319 Hematuria, unspecified: Secondary | ICD-10-CM | POA: Diagnosis not present

## 2023-10-04 DIAGNOSIS — R319 Hematuria, unspecified: Secondary | ICD-10-CM | POA: Diagnosis not present

## 2023-10-09 DIAGNOSIS — N201 Calculus of ureter: Secondary | ICD-10-CM | POA: Diagnosis not present

## 2023-10-12 ENCOUNTER — Telehealth: Payer: Self-pay | Admitting: Cardiovascular Disease

## 2023-10-12 ENCOUNTER — Other Ambulatory Visit: Payer: Self-pay | Admitting: Urology

## 2023-10-12 NOTE — Telephone Encounter (Signed)
   Pre-operative Risk Assessment    Patient Name: Lisa Gilbert  DOB: [birth year] MRN: [id number]   Date of last office visit: 10/04/22 Date of next office visit: 12/04/23   Request for Surgical Clearance    Procedure:  Ureteroscopy, Laser Lithotripsy and Stent Placement  Date of Surgery:  Clearance 11/14/23                                Surgeon:  Dr. Leila Punt Surgeon's Group or Practice Name:  [facility] Phone number:  [phone]  Fax number:  [phone]   Type of Clearance Requested:   - Medical    Type of Anesthesia:  General    Additional requests/questions:     SignedChrystie Crass   10/12/2023, 3:49 PM

## 2023-10-12 NOTE — Telephone Encounter (Signed)
   Name: Lisa Gilbert  DOB: Dec 30, 1945  MRN: [id number]  Primary Cardiologist: Kardie Tobb, DO  Chart reviewed as part of pre-operative protocol coverage. Because of Lisa Gilbert's past medical history and time since last visit, she will require a follow-up in-office visit in order to better assess preoperative cardiovascular risk.  Pre-op covering staff: - Please schedule appointment and call patient to inform them. If patient already had an upcoming appointment within acceptable timeframe, please add "pre-op clearance" to the appointment notes so provider is aware. - Please contact requesting surgeon's office via preferred method (i.e, phone, fax) to inform them of need for appointment prior to surgery.   Francene Ing, Retha Cast, NP  10/12/2023, 3:53 PM

## 2023-10-12 NOTE — Telephone Encounter (Signed)
 Patient has been scheduled for pre op clearance.

## 2023-10-18 DIAGNOSIS — R791 Abnormal coagulation profile: Secondary | ICD-10-CM | POA: Diagnosis not present

## 2023-10-28 NOTE — Progress Notes (Unsigned)
 Cardiology Office Note    Date:  10/28/2023  ID:  Lisa Gilbert, Lisa Gilbert [birth year], MRN [id number] PCP:  Tamela Fake, MD  Cardiologist:  Jerryl Morin, DO  Electrophysiologist:  None   Chief Complaint: ***  History of Present Illness: .    Lisa Gilbert is a 78 y.o. female with visit-pertinent history of CAD, HTN, HLD, PVD, tobacco use, right carotid artery stenting.  Patient with history of MI in 2015 with placement of 2 stents at that time.  Patient also has history of right carotid artery stenting and bilateral lower extremity stenting by Dr. Annalee Barren with vascular surgery.  Patient has been on Anticoagulation for unclear reasons.  Patient is followed by Dr. Revankar for cardiology and has also been followed by Dr. Dean Every for PVD.  Patient had lower arterial Doppler studies in 11/2021 revealing a right ABI of 0.77 and a left of 0.58.  Patient had abdominal aortography with bifemoral runoff in 11/2021 revealing 90% distal abdominal aortic stenosis with a 40 mm gradient, patent bilateral iliac stents, 80 to 90% bilateral common femoral artery stenosis with a total left SFA reconstituting in the adductor canal with two-vessel runoff.  Patient was referred to Dr. Fulton Job with vascular surgery.  Patient was last in clinic by Dr. Dean Every on 10/04/2022, she remains doing well cardiac standpoint.  Today she presents for preoperative cardiac evaluation for Ureteroscopy, Laser Lithotripsy and Stent Placement. She reports that she   Preoperative cardiac evaluation: Ureteroscopy, Laser Lithotripsy and Stent Placement  {Select to add RCRI Risk (<1%=LOW; >/=1%=HIGH) (Optional):21036017}  {Select if HIGH (RCRI >/=1%) Risk (Optional):21036030} Recommendations: {2014 ACC/AHA Perioperative Guidelines  :21036001} Antiplatelet and/or Anticoagulation Recommendations: {Antiplatelet Recommendations                  :21036016} {Anticoagulation Recommendations           :78295621}   CAD:  HTN: Blood pressure today   HLD: Last lipid profile on    Labwork independently reviewed:   ROS: .   *** denies chest pain, shortness of breath, lower extremity edema, fatigue, palpitations, melena, hematuria, hemoptysis, diaphoresis, weakness, presyncope, syncope, orthopnea, and PND.  All other systems are reviewed and otherwise negative.  Studies Reviewed: Aaron Aas    EKG:  EKG is ordered today, personally reviewed, demonstrating ***     CV Studies: Cardiac studies reviewed are outlined and summarized above. Otherwise please see EMR for full report. Cardiac Studies & Procedures   ______________________________________________________________________________________________   STRESS TESTS  MYOCARDIAL PERFUSION IMAGING 02/05/2018  Narrative  Nuclear stress EF: 90%.  The left ventricular ejection fraction is hyperdynamic (>65%).  Blood pressure demonstrated a normal response to exercise.  There was no ST segment deviation noted during stress.  The study is normal.  This is a low risk study.   ECHOCARDIOGRAM  ECHOCARDIOGRAM COMPLETE 11/02/2021  Narrative ECHOCARDIOGRAM REPORT    Patient Name:   Lisa Gilbert Date of Exam: 11/02/2021 Medical Rec #:  [id number]       Height:       60.0 in Accession #:    [id number]      Weight:       136.4 lb Date of Birth:  09-23-1945      BSA:          1.586 m Patient Age:    75 years        BP:           122/56 mmHg Patient Gender: F  HR:           7 bpm. Exam Location:  [facility]  Procedure: 2D Echo, Cardiac Doppler, Color Doppler, Strain Analysis and Intracardiac Opacification Agent  Indications:    CAD Native Vessel I25.10  History:        Patient has no prior history of Echocardiogram examinations. PAD and Carotid Disease; Risk Factors:Hypertension, Current Smoker and Dyslipidemia.  Sonographer:    Kristen Petri RDCS Referring Phys: Pollie Bring [facility]  IMPRESSIONS   1. Left ventricular ejection fraction, by estimation, is 60 to 65%.  The left ventricle has normal function. The left ventricle has no regional wall motion abnormalities. There is mild concentric left ventricular hypertrophy. Left ventricular diastolic parameters are consistent with Grade I diastolic dysfunction (impaired relaxation). 2. Right ventricular systolic function is normal. The right ventricular size is normal. There is normal pulmonary artery systolic pressure. 3. The mitral valve is normal in structure. No evidence of mitral valve regurgitation. No evidence of mitral stenosis. 4. The aortic valve is tricuspid. There is mild calcification of the aortic valve. Aortic valve regurgitation is not visualized. Mild aortic valve stenosis. 5. Aortic Normal DTA. 6. The inferior vena cava is normal in size with greater than 50% respiratory variability, suggesting right atrial pressure of 3 mmHg.  FINDINGS Left Ventricle: Left ventricular ejection fraction, by estimation, is 60 to 65%. The left ventricle has normal function. The left ventricle has no regional wall motion abnormalities. Definity  contrast agent was given IV to delineate the left ventricular endocardial borders. The left ventricular internal cavity size was normal in size. There is mild concentric left ventricular hypertrophy. Left ventricular diastolic parameters are consistent with Grade I diastolic dysfunction (impaired relaxation). Indeterminate filling pressures.  Right Ventricle: The right ventricular size is normal. No increase in right ventricular wall thickness. Right ventricular systolic function is normal. There is normal pulmonary artery systolic pressure. The tricuspid regurgitant velocity is 2.29 m/s, and with an assumed right atrial pressure of 3 mmHg, the estimated right ventricular systolic pressure is 24.0 mmHg.  Left Atrium: Left atrial size was normal in size.  Right Atrium: Right atrial size was normal in size.  Pericardium: There is no evidence of pericardial effusion.  Mitral  Valve: The mitral valve is normal in structure. No evidence of mitral valve regurgitation. No evidence of mitral valve stenosis.  Tricuspid Valve: The tricuspid valve is normal in structure. Tricuspid valve regurgitation is mild . No evidence of tricuspid stenosis.  Aortic Valve: The aortic valve is tricuspid. There is mild calcification of the aortic valve. Aortic valve regurgitation is not visualized. Mild aortic stenosis is present. Aortic valve mean gradient measures 14.0 mmHg. Aortic valve peak gradient measures 23.4 mmHg. Aortic valve area, by VTI measures 1.50 cm.  Pulmonic Valve: The pulmonic valve was normal in structure. Pulmonic valve regurgitation is not visualized. No evidence of pulmonic stenosis.  Aorta: The aortic root and ascending aorta are structurally normal, with no evidence of dilitation, the aortic arch was not well visualized and Normal DTA.  Venous: A normal flow pattern is recorded from the right upper pulmonary vein. The inferior vena cava is normal in size with greater than 50% respiratory variability, suggesting right atrial pressure of 3 mmHg.  IAS/Shunts: No atrial level shunt detected by color flow Doppler.   LEFT VENTRICLE PLAX 2D LVIDd:         3.40 cm   Diastology LVIDs:         2.30 cm   LV  e' medial:    6.31 cm/s LV PW:         1.20 cm   LV E/e' medial:  15.5 LV IVS:        1.20 cm   LV e' lateral:   5.77 cm/s LVOT diam:     1.80 cm   LV E/e' lateral: 17.0 LV SV:         74 LV SV Index:   46 LVOT Area:     2.54 cm   RIGHT VENTRICLE             IVC RV S prime:     14.30 cm/s  IVC diam: 1.00 cm TAPSE (M-mode): 2.6 cm  LEFT ATRIUM             Index        RIGHT ATRIUM          Index LA diam:        3.60 cm 2.27 cm/m   RA Area:     7.01 cm LA Vol (A2C):   24.7 ml 15.57 ml/m  RA Volume:   12.10 ml 7.63 ml/m LA Vol (A4C):   31.7 ml 19.98 ml/m LA Biplane Vol: 29.4 ml 18.53 ml/m AORTIC VALVE AV Area (Vmax):    1.48 cm AV Area (Vmean):    1.21 cm AV Area (VTI):     1.50 cm AV Vmax:           242.00 cm/s AV Vmean:          177.000 cm/s AV VTI:            0.489 m AV Peak Grad:      23.4 mmHg AV Mean Grad:      14.0 mmHg LVOT Vmax:         141.00 cm/s LVOT Vmean:        84.200 cm/s LVOT VTI:          0.289 m LVOT/AV VTI ratio: 0.59  AORTA Ao Root diam: 2.80 cm Ao Asc diam:  3.10 cm Ao Desc diam: 1.90 cm  MITRAL VALVE                TRICUSPID VALVE MV Area (PHT): 2.66 cm     TR Peak grad:   21.0 mmHg MV Decel Time: 285 msec     TR Vmax:        229.00 cm/s MV E velocity: 98.10 cm/s MV A velocity: 123.00 cm/s  SHUNTS MV E/A ratio:  0.80         Systemic VTI:  0.29 m Systemic Diam: 1.80 cm  Zoe Hinds MD Electronically signed by Zoe Hinds MD Signature Date/Time: 11/02/2021/5:19:09 PM    Final          ______________________________________________________________________________________________       Current Reported Medications:.    No outpatient medications have been marked as taking for the 10/30/23 encounter (Appointment) with Aldrich Lloyd D, NP.    Physical Exam:    VS:  There were no vitals taken for this visit.   Wt Readings from Last 3 Encounters:  10/04/22 134 lb 6.4 oz (61 kg)  08/22/22 132 lb (59.9 kg)  05/24/22 134 lb 9.6 oz (61.1 kg)    GEN: Well nourished, well developed in no acute distress NECK: No JVD; No carotid bruits CARDIAC: ***RRR, no murmurs, rubs, gallops RESPIRATORY:  Clear to auscultation without rales, wheezing or rhonchi  ABDOMEN: Soft, non-tender, non-distended EXTREMITIES:  No edema; No acute  deformity     Asessement and Plan:.     ***     Disposition: F/u with ***  Signed, Martha Soltys D Jahmad Petrich, NP

## 2023-10-30 ENCOUNTER — Ambulatory Visit: Attending: Cardiology | Admitting: Cardiology

## 2023-10-30 ENCOUNTER — Encounter: Payer: Self-pay | Admitting: Cardiology

## 2023-10-30 VITALS — BP 108/58 | HR 56 | Ht 61.0 in | Wt 135.6 lb

## 2023-10-30 DIAGNOSIS — E782 Mixed hyperlipidemia: Secondary | ICD-10-CM | POA: Diagnosis not present

## 2023-10-30 DIAGNOSIS — I739 Peripheral vascular disease, unspecified: Secondary | ICD-10-CM

## 2023-10-30 DIAGNOSIS — Z01810 Encounter for preprocedural cardiovascular examination: Secondary | ICD-10-CM | POA: Diagnosis not present

## 2023-10-30 DIAGNOSIS — Z720 Tobacco use: Secondary | ICD-10-CM | POA: Diagnosis not present

## 2023-10-30 DIAGNOSIS — I2510 Atherosclerotic heart disease of native coronary artery without angina pectoris: Secondary | ICD-10-CM | POA: Diagnosis not present

## 2023-10-30 DIAGNOSIS — I10 Essential (primary) hypertension: Secondary | ICD-10-CM

## 2023-10-30 NOTE — Progress Notes (Signed)
 COVID Vaccine Completed:  Date of COVID positive in last 90 days:  PCP - Tamela Fake, MD Cardiologist - Kardie Tobb, DO (clearance appt on 10-30-23)  Chest x-ray -  EKG -  Stress Test - 02-05-18 Epic ECHO - 11-02-21 Epic Cardiac Cath - 02-25-15 CEW Pacemaker/ICD device last checked: Spinal Cord Stimulator:  Bowel Prep -   Sleep Study -  CPAP -   Fasting Blood Sugar -  Checks Blood Sugar _____ times a day  Last dose of GLP1 agonist-  N/A GLP1 instructions:  Hold 7 days before surgery    Last dose of SGLT-2 inhibitors-  N/A SGLT-2 instructions:  Hold 3 days before surgery    Blood Thinner Instructions:  Coumadin  Pletal  Last dose:   Time: Aspirin  Instructions:  ASA 81 Last Dose:  Activity level:  Can go up a flight of stairs and perform activities of daily living without stopping and without symptoms of chest pain or shortness of breath.  Able to exercise without symptoms  Unable to go up a flight of stairs without symptoms of     Anesthesia review: Carotid artery stenosis, PVD, HTN, CAD  Patient denies shortness of breath, fever, cough and chest pain at PAT appointment  Patient verbalized understanding of instructions that were given to them at the PAT appointment. Patient was also instructed that they will need to review over the PAT instructions again at home before surgery.

## 2023-10-30 NOTE — Patient Instructions (Addendum)
 SURGICAL WAITING ROOM VISITATION Patients having surgery or a procedure may have no more than 2 support people in the waiting area - these visitors may rotate.    Children under the age of 50 must have an adult with them who is not the patient.  If the patient needs to stay at the hospital during part of their recovery, the visitor guidelines for inpatient rooms apply. Pre-op nurse will coordinate an appropriate time for 1 support person to accompany patient in pre-op.  This support person may not rotate.    Please refer to the [facility] website for the visitor guidelines for Inpatients (after your surgery is over and you are in a regular room).       Your procedure is scheduled on: 11-14-23   Report to [facility] Main Entrance    Report to admitting at 8:45 AM   Call this number if you have problems the morning of surgery [phone]   Do not eat food or drink liquids :After Midnight.          If you have questions, please contact your surgeon's office.   FOLLOW ANY ADDITIONAL PRE OP INSTRUCTIONS YOU RECEIVED FROM YOUR SURGEON'S OFFICE!!!     Oral Hygiene is also important to reduce your risk of infection.                                    Remember - BRUSH YOUR TEETH THE MORNING OF SURGERY WITH YOUR REGULAR TOOTHPASTE   Do NOT smoke after Midnight   Take these medicines the morning of surgery with A SIP OF WATER:    Atorvastatin   Bupropion   Donepezil   Allegra   Levothyroxine   Sertraline   Tylenol  if needed   Okay to use inhalers and Flonase nasal spray  Stop all vitamins and herbal supplements 7 days before surgery  Hold ASA, Coumadin , Pletal    Bring CPAP mask and tubing day of surgery.                              You may not have any metal on your body including hair pins, jewelry, and body piercing             Do not wear make-up, lotions, powders, perfumes, or deodorant  Do not wear nail polish including gel and S&S, artificial/acrylic nails,  or any other type of covering on natural nails including finger and toenails. If you have artificial nails, gel coating, etc. that needs to be removed by a nail salon please have this removed prior to surgery or surgery may need to be canceled/ delayed if the surgeon/ anesthesia feels like they are unable to be safely monitored.   Do not shave  48 hours prior to surgery.    Do not bring valuables to the hospital. [facility] IS NOT RESPONSIBLE   FOR VALUABLES.   Contacts, dentures or bridgework may not be worn into surgery.  DO NOT BRING YOUR HOME MEDICATIONS TO THE HOSPITAL. PHARMACY WILL DISPENSE MEDICATIONS LISTED ON YOUR MEDICATION LIST TO YOU DURING YOUR ADMISSION IN THE HOSPITAL!    Patients discharged on the day of surgery will not be allowed to drive home.  Someone NEEDS to stay with you for the first 24 hours after anesthesia.   Special Instructions: Bring a copy of your healthcare power of attorney  and living will documents the day of surgery if you haven't scanned them before.              Please read over the following fact sheets you were given: IF YOU HAVE QUESTIONS ABOUT YOUR PRE-OP INSTRUCTIONS PLEASE CALL [phone] [place]  If you received a COVID test during your pre-op visit  it is requested that you wear a mask when out in public, stay away from anyone that may not be feeling well and notify your surgeon if you develop symptoms. If you test positive for Covid or have been in contact with anyone that has tested positive in the last 10 days please notify you surgeon.  [facility] - Preparing for Surgery Before surgery, you can play an important role.  Because skin is not sterile, your skin needs to be as free of germs as possible.  You can reduce the number of germs on your skin by washing with CHG (chlorahexidine gluconate) soap before surgery.  CHG is an antiseptic cleaner which kills germs and bonds with the skin to continue killing germs even after washing. Please DO NOT  use if you have an allergy to CHG or antibacterial soaps.  If your skin becomes reddened/irritated stop using the CHG and inform your nurse when you arrive at Short Stay. Do not shave (including legs and underarms) for at least 48 hours prior to the first CHG shower.  You may shave your face/neck.  Please follow these instructions carefully:  1.  Shower with CHG Soap the night before surgery and the  morning of surgery.  2.  If you choose to wash your hair, wash your hair first as usual with your normal  shampoo.  3.  After you shampoo, rinse your hair and body thoroughly to remove the shampoo.                             4.  Use CHG as you would any other liquid soap.  You can apply chg directly to the skin and wash.  Gently with a scrungie or clean washcloth.  5.  Apply the CHG Soap to your body ONLY FROM THE NECK DOWN.   Do   not use on face/ open                           Wound or open sores. Avoid contact with eyes, ears mouth and   genitals (private parts).                       Wash face,  Genitals (private parts) with your normal soap.             6.  Wash thoroughly, paying special attention to the area where your    surgery  will be performed.  7.  Thoroughly rinse your body with warm water from the neck down.  8.  DO NOT shower/wash with your normal soap after using and rinsing off the CHG Soap.                9.  Pat yourself dry with a clean towel.            10.  Wear clean pajamas.            11.  Place clean sheets on your bed the night of your first shower and do not  sleep  with pets. Day of Surgery : Do not apply any lotions/deodorants the morning of surgery.  Please wear clean clothes to the hospital/surgery center.  FAILURE TO FOLLOW THESE INSTRUCTIONS MAY RESULT IN THE CANCELLATION OF YOUR SURGERY  PATIENT SIGNATURE_________________________________  NURSE  SIGNATURE__________________________________  ________________________________________________________________________

## 2023-10-30 NOTE — Patient Instructions (Addendum)
 Medication Instructions:  No changes *If you need a refill on your cardiac medications before your next appointment, please call your pharmacy*  Lab Work: No labs  Testing/Procedures: No testing  Follow-Up: At [facility], you and your health needs are our priority.  As part of our continuing mission to provide you with exceptional heart care, our providers are all part of one team.  This team includes your primary Cardiologist (physician) and Advanced Practice Providers or APPs (Physician Assistants and Nurse Practitioners) who all work together to provide you with the care you need, when you need it.  Your next appointment:   1 year(s)  Provider:   Nelia Balzarine, MD    We recommend signing up for the patient portal called "MyChart".  Sign up information is provided on this After Visit Summary.  MyChart is used to connect with patients for Virtual Visits (Telemedicine).  Patients are able to view lab/test results, encounter notes, upcoming appointments, etc.  Non-urgent messages can be sent to your provider as well.   To learn more about what you can do with MyChart, go to [email or web address].

## 2023-10-31 ENCOUNTER — Encounter (HOSPITAL_COMMUNITY)
Admission: RE | Admit: 2023-10-31 | Discharge: 2023-10-31 | Disposition: A | Discharge status: Home / Self Care | Source: Ambulatory Visit | Attending: Urology | Admitting: Urology

## 2023-10-31 ENCOUNTER — Other Ambulatory Visit: Payer: Self-pay

## 2023-10-31 ENCOUNTER — Encounter (HOSPITAL_COMMUNITY): Payer: Self-pay

## 2023-10-31 VITALS — BP 133/77 | HR 69 | Temp 98.2°F | Resp 16 | Ht 61.0 in | Wt 132.0 lb

## 2023-10-31 DIAGNOSIS — N201 Calculus of ureter: Secondary | ICD-10-CM | POA: Insufficient documentation

## 2023-10-31 DIAGNOSIS — E039 Hypothyroidism, unspecified: Secondary | ICD-10-CM | POA: Diagnosis not present

## 2023-10-31 DIAGNOSIS — F17210 Nicotine dependence, cigarettes, uncomplicated: Secondary | ICD-10-CM | POA: Insufficient documentation

## 2023-10-31 DIAGNOSIS — I739 Peripheral vascular disease, unspecified: Secondary | ICD-10-CM | POA: Diagnosis not present

## 2023-10-31 DIAGNOSIS — I10 Essential (primary) hypertension: Secondary | ICD-10-CM | POA: Insufficient documentation

## 2023-10-31 DIAGNOSIS — Z01818 Encounter for other preprocedural examination: Secondary | ICD-10-CM | POA: Insufficient documentation

## 2023-10-31 DIAGNOSIS — I2510 Atherosclerotic heart disease of native coronary artery without angina pectoris: Secondary | ICD-10-CM | POA: Insufficient documentation

## 2023-10-31 DIAGNOSIS — Z7901 Long term (current) use of anticoagulants: Secondary | ICD-10-CM | POA: Insufficient documentation

## 2023-10-31 HISTORY — DX: Personal history of urinary calculi: Z87.442

## 2023-10-31 HISTORY — DX: Unspecified malignant neoplasm of skin, unspecified: C44.90

## 2023-10-31 LAB — BASIC METABOLIC PANEL WITH GFR
Anion gap: 7 (ref 5–15)
BUN: 14 mg/dL (ref 8–23)
CO2: 23 mmol/L (ref 22–32)
Calcium: 9 mg/dL (ref 8.9–10.3)
Chloride: 108 mmol/L (ref 98–111)
Creatinine, Ser: 0.89 mg/dL (ref 0.44–1.00)
GFR, Estimated: >60 mL/min (ref >60)
Glucose, Bld: 81 mg/dL (ref 70–99)
Potassium: 3.9 mmol/L (ref 3.5–5.1)
Sodium: 138 mmol/L (ref 135–145)

## 2023-11-01 NOTE — Progress Notes (Signed)
 Anesthesia Chart Review   Case: [id number] Date/Time: 11/14/23 1045   Procedure: CYSTOSCOPY/URETEROSCOPY/HOLMIUM LASER/STENT PLACEMENT (Right) - CYSTOSCOPY/RIGHT URETEROSCOPY/HOLMIUM LASER/STENT PLACEMENT   Anesthesia type: General   Diagnosis: Ureteral stone [N20.1]   Pre-op diagnosis: RIGHT URETERAL STONE   Location: [facility] PROCEDURE [facility] / [facility]   Surgeons: Samson Croak, MD       DISCUSSION:78 y.o. smoker with h/o HTN, PVD, CAD s/p DES x 2 2015, right carotid artery stent, hypothyroidism, right ureteral stone scheduled for above procedure 11/14/2023 with Dr. Leila Punt.   Per cardiology preoperative evaluation 10/30/2023, Preoperative cardiac evaluation: Ureteroscopy, Laser Lithotripsy and Stent Placement. Lisa Gilbert perioperative risk of a major cardiac event is 0.9% according to the Revised Cardiac Risk Index (RCRI).  Therefore, she is at low risk for perioperative complications.   Her functional capacity is good at 7.34 METs according to the Duke Activity Status Index (DASI). Recommendations:According to ACC/AHA guidelines, no further cardiovascular testing needed.  The patient may proceed to surgery at acceptable risk.  Antiplatelet and/or Anticoagulation Recommendations:None requested.  VS: BP 133/77   Pulse 69   Temp 36.8 C (Oral)   Resp 16   Ht 5' 1 (1.549 m)   Wt 59.9 kg   SpO2 98%   BMI 24.94 kg/m   PROVIDERS: Tamela Fake, MD is PCP   Cardiologist:  Nelia Balzarine, MD  LABS: Labs reviewed: Acceptable for surgery. (all labs ordered are listed, but only abnormal results are displayed)  Labs Reviewed  BASIC METABOLIC PANEL WITH GFR     IMAGES: VAS US  Carotid 04/11/2023 Summary:  Right Carotid: Widely patent right distal CCA to distal ICA stent without                 evidence of stenosis.   Left Carotid: Velocities in the left ICA are consistent with a 1-39%  stenosis.   EKG:   CV: Echo 11/02/2021 1. Left ventricular ejection fraction, by  estimation, is 60 to 65%. The  left ventricle has normal function. The left ventricle has no regional  wall motion abnormalities. There is mild concentric left ventricular  hypertrophy. Left ventricular diastolic  parameters are consistent with Grade I diastolic dysfunction (impaired  relaxation).   2. Right ventricular systolic function is normal. The right ventricular  size is normal. There is normal pulmonary artery systolic pressure.   3. The mitral valve is normal in structure. No evidence of mitral valve  regurgitation. No evidence of mitral stenosis.   4. The aortic valve is tricuspid. There is mild calcification of the  aortic valve. Aortic valve regurgitation is not visualized. Mild aortic  valve stenosis.   5. Aortic Normal DTA.   6. The inferior vena cava is normal in size with greater than 50%  respiratory variability, suggesting right atrial pressure of 3 mmHg.   Myocardial Perfusion 02/05/2018 Nuclear stress EF: 90%. The left ventricular ejection fraction is hyperdynamic (>65%). Blood pressure demonstrated a normal response to exercise. There was no ST segment deviation noted during stress. The study is normal. This is a low risk study.   Past Medical History:  Diagnosis Date   Abnormal INR 11/30/2015   Allergic rhinosinusitis    Anticoagulated on Coumadin  03/24/2019   Arteriosclerosis of coronary artery 03/16/2015   Asymptomatic bilateral carotid artery stenosis 09/30/2015   Atherosclerosis 01/14/2018   Bilateral carotid bruits 01/18/2018   Bronchial asthma    Chronic diarrhea    Cigarette smoker 11/01/2021   Coronary artery disease involving native  coronary artery of native heart without angina pectoris 12/04/2019   Depression 01/14/2018   Disease of thyroid  gland 01/14/2018   hypothyroidism   Encounter for therapeutic drug level monitoring 05/06/2015   Essential hypertension 05/20/2019   Ex-smoker 01/18/2018   GI bleed 03/16/2015   History of kidney stones     History of right-sided carotid endarterectomy    Hyperlipidemia    Hypothyroidism    IBS (irritable bowel syndrome)    Incisional hernia 07/19/2016   Long term current use of anticoagulant therapy 05/06/2015   Mild memory disturbance    Mixed hyperlipidemia 05/20/2019   Osteoporosis    Overweight    Peripheral vascular disease (HCC) 05/06/2015   Skin cancer    Vitamin D  deficiency     Past Surgical History:  Procedure Laterality Date   ABDOMINAL AORTOGRAM W/LOWER EXTREMITY Bilateral 12/19/2021   Procedure: ABDOMINAL AORTOGRAM W/LOWER EXTREMITY;  Surgeon: Avanell Leigh, MD;  Location: [facility]VAS[facility];  Service: Cardiovascular;  Laterality: Bilateral;   CAROTID STENT     CESAREAN SECTION     COLONOSCOPY     CORONARY ANGIOPLASTY WITH STENT PLACEMENT  2015   2 Stents   HERNIA REPAIR  03/31/2019   Dr. Nonda Bays III [facility] at [facility]    MEDICATIONS:  acetaminophen  (TYLENOL ) 500 MG tablet   albuterol (VENTOLIN HFA) 108 (90 Base) MCG/ACT inhaler   alendronate (FOSAMAX) 70 MG tablet   aspirin  EC 81 MG tablet   atorvastatin (LIPITOR) 80 MG tablet   buPROPion (WELLBUTRIN XL) 150 MG 24 hr tablet   calcium carbonate (OS-CAL) 600 MG tablet   cilostazol  (PLETAL ) 100 MG tablet   diclofenac Sodium (VOLTAREN) 1 % GEL   donepezil (ARICEPT) 10 MG tablet   fexofenadine (ALLEGRA) 60 MG tablet   fluticasone (FLONASE) 50 MCG/ACT nasal spray   levothyroxine (SYNTHROID, LEVOTHROID) 25 MCG tablet   lisinopril (PRINIVIL,ZESTRIL) 5 MG tablet   montelukast (SINGULAIR) 10 MG tablet   Multiple Vitamins-Minerals (MULTIVITAMIN WITH MINERALS) tablet   nitroGLYCERIN  (NITROSTAT ) 0.4 MG SL tablet   sertraline (ZOLOFT) 50 MG tablet   Vitamin D , Ergocalciferol , (DRISDOL) 50000 units CAPS capsule   warfarin (COUMADIN ) 5 MG tablet   No current facility-administered medications for this encounter.   Chick Cotton Ward, PA-C [facility]-Surgical Testing [phone]

## 2023-11-13 DIAGNOSIS — R791 Abnormal coagulation profile: Secondary | ICD-10-CM | POA: Diagnosis not present

## 2023-11-14 ENCOUNTER — Ambulatory Visit (HOSPITAL_COMMUNITY)

## 2023-11-14 ENCOUNTER — Ambulatory Visit (HOSPITAL_COMMUNITY)
Admission: RE | Admit: 2023-11-14 | Discharge: 2023-11-14 | Disposition: A | Discharge status: Home / Self Care | Source: Ambulatory Visit | Attending: Urology | Admitting: Urology

## 2023-11-14 ENCOUNTER — Ambulatory Visit (HOSPITAL_COMMUNITY): Payer: Self-pay | Admitting: Physician Assistant

## 2023-11-14 ENCOUNTER — Encounter (HOSPITAL_COMMUNITY)
Admission: RE | Disposition: A | Discharge status: Home / Self Care | Payer: Self-pay | Source: Ambulatory Visit | Attending: Urology

## 2023-11-14 ENCOUNTER — Ambulatory Visit (HOSPITAL_COMMUNITY): Admitting: Anesthesiology

## 2023-11-14 ENCOUNTER — Other Ambulatory Visit: Payer: Self-pay

## 2023-11-14 ENCOUNTER — Encounter (HOSPITAL_COMMUNITY): Payer: Self-pay | Admitting: Urology

## 2023-11-14 DIAGNOSIS — Z79890 Hormone replacement therapy (postmenopausal): Secondary | ICD-10-CM | POA: Diagnosis not present

## 2023-11-14 DIAGNOSIS — E039 Hypothyroidism, unspecified: Secondary | ICD-10-CM | POA: Insufficient documentation

## 2023-11-14 DIAGNOSIS — I2510 Atherosclerotic heart disease of native coronary artery without angina pectoris: Secondary | ICD-10-CM | POA: Diagnosis not present

## 2023-11-14 DIAGNOSIS — I10 Essential (primary) hypertension: Secondary | ICD-10-CM

## 2023-11-14 DIAGNOSIS — N132 Hydronephrosis with renal and ureteral calculous obstruction: Secondary | ICD-10-CM

## 2023-11-14 DIAGNOSIS — N201 Calculus of ureter: Secondary | ICD-10-CM | POA: Diagnosis not present

## 2023-11-14 DIAGNOSIS — Z7901 Long term (current) use of anticoagulants: Secondary | ICD-10-CM | POA: Insufficient documentation

## 2023-11-14 DIAGNOSIS — I739 Peripheral vascular disease, unspecified: Secondary | ICD-10-CM | POA: Diagnosis not present

## 2023-11-14 DIAGNOSIS — Z79899 Other long term (current) drug therapy: Secondary | ICD-10-CM | POA: Diagnosis not present

## 2023-11-14 DIAGNOSIS — J45909 Unspecified asthma, uncomplicated: Secondary | ICD-10-CM | POA: Insufficient documentation

## 2023-11-14 DIAGNOSIS — F17210 Nicotine dependence, cigarettes, uncomplicated: Secondary | ICD-10-CM | POA: Diagnosis not present

## 2023-11-14 HISTORY — PX: CYSTOSCOPY/URETEROSCOPY/HOLMIUM LASER/STENT PLACEMENT: SHX6546

## 2023-11-14 LAB — CBC
HCT: 43.3 % (ref 36.0–46.0)
Hemoglobin: 13.8 g/dL (ref 12.0–15.0)
MCH: 30.4 pg (ref 26.0–34.0)
MCHC: 31.9 g/dL (ref 30.0–36.0)
MCV: 95.4 fL (ref 80.0–100.0)
Platelets: 250 10*3/uL (ref 150–400)
RBC: 4.54 MIL/uL (ref 3.87–5.11)
RDW: 13.4 % (ref 11.5–15.5)
WBC: 8.6 10*3/uL (ref 4.0–10.5)
nRBC: 0 % (ref 0.0–0.2)

## 2023-11-14 LAB — APTT: aPTT: 27 s (ref 24–36)

## 2023-11-14 LAB — PROTIME-INR
INR: 1 (ref 0.8–1.2)
Prothrombin Time: 13.5 s (ref 11.4–15.2)

## 2023-11-14 SURGERY — CYSTOSCOPY/URETEROSCOPY/HOLMIUM LASER/STENT PLACEMENT
Anesthesia: General | Laterality: Right

## 2023-11-14 MED ORDER — FENTANYL CITRATE (PF) 100 MCG/2ML IJ SOLN
INTRAMUSCULAR | Status: DC | PRN
  Administered 2023-11-14: 50 ug via INTRAVENOUS

## 2023-11-14 MED ORDER — EPINEPHRINE PF 1 MG/ML IJ SOLN
INTRAMUSCULAR | Status: AC
  Filled 2023-11-14: qty 1

## 2023-11-14 MED ORDER — CEFAZOLIN SODIUM-DEXTROSE 2-4 GM/100ML-% IV SOLN
2.0000 g | INTRAVENOUS | Status: AC
  Administered 2023-11-14: 2 g via INTRAVENOUS
  Filled 2023-11-14: qty 100

## 2023-11-14 MED ORDER — PROPOFOL 10 MG/ML IV BOLUS
INTRAVENOUS | Status: DC | PRN
  Administered 2023-11-14: 100 mg via INTRAVENOUS

## 2023-11-14 MED ORDER — FENTANYL CITRATE (PF) 100 MCG/2ML IJ SOLN
INTRAMUSCULAR | Status: AC
  Filled 2023-11-14: qty 2

## 2023-11-14 MED ORDER — LIDOCAINE HCL (PF) 2 % IJ SOLN
INTRAMUSCULAR | Status: DC | PRN
  Administered 2023-11-14: 50 mg

## 2023-11-14 MED ORDER — DEXAMETHASONE SODIUM PHOSPHATE 10 MG/ML IJ SOLN
INTRAMUSCULAR | Status: AC
  Filled 2023-11-14: qty 1

## 2023-11-14 MED ORDER — LACTATED RINGERS IV SOLN: INTRAVENOUS | Status: DC

## 2023-11-14 MED ORDER — ORAL CARE MOUTH RINSE: 15.0000 mL | Freq: Once | OROMUCOSAL | Status: AC

## 2023-11-14 MED ORDER — SODIUM CHLORIDE 0.9 % IR SOLN
Status: DC | PRN
  Administered 2023-11-14: 3000 mL

## 2023-11-14 MED ORDER — LACTATED RINGERS IV SOLN: INTRAVENOUS | Status: DC | PRN

## 2023-11-14 MED ORDER — EPHEDRINE SULFATE-NACL 50-0.9 MG/10ML-% IV SOSY
PREFILLED_SYRINGE | INTRAVENOUS | Status: DC | PRN
Start: 2023-11-14 — End: 2023-11-14
  Administered 2023-11-14: 5 mg via INTRAVENOUS
  Administered 2023-11-14: 10 mg via INTRAVENOUS
  Administered 2023-11-14: 5 mg via INTRAVENOUS

## 2023-11-14 MED ORDER — FENTANYL CITRATE PF 50 MCG/ML IJ SOSY: 25.0000 ug | PREFILLED_SYRINGE | INTRAMUSCULAR | Status: DC | PRN

## 2023-11-14 MED ORDER — ONDANSETRON HCL 4 MG/2ML IJ SOLN
INTRAMUSCULAR | Status: DC | PRN
  Administered 2023-11-14: 4 mg via INTRAVENOUS

## 2023-11-14 MED ORDER — LIDOCAINE HCL (PF) 2 % IJ SOLN
INTRAMUSCULAR | Status: AC
  Filled 2023-11-14: qty 5

## 2023-11-14 MED ORDER — IOHEXOL 300 MG/ML  SOLN
INTRAMUSCULAR | Status: DC | PRN
  Administered 2023-11-14: 10 mL

## 2023-11-14 MED ORDER — HYDROCODONE-ACETAMINOPHEN 5-325 MG PO TABS: 1.0000 | ORAL_TABLET | Freq: Four times a day (QID) | ORAL | 0 refills | Status: AC | PRN

## 2023-11-14 MED ORDER — ONDANSETRON HCL 4 MG/2ML IJ SOLN
INTRAMUSCULAR | Status: AC
  Filled 2023-11-14: qty 2

## 2023-11-14 MED ORDER — PHENYLEPHRINE 80 MCG/ML (10ML) SYRINGE FOR IV PUSH (FOR BLOOD PRESSURE SUPPORT)
PREFILLED_SYRINGE | INTRAVENOUS | Status: DC | PRN
  Administered 2023-11-14: 80 ug via INTRAVENOUS
  Administered 2023-11-14: 160 ug via INTRAVENOUS
  Administered 2023-11-14: 80 ug via INTRAVENOUS

## 2023-11-14 MED ORDER — PROPOFOL 10 MG/ML IV BOLUS
INTRAVENOUS | Status: AC
  Filled 2023-11-14: qty 20

## 2023-11-14 MED ORDER — ACETAMINOPHEN 500 MG PO TABS
1000.0000 mg | ORAL_TABLET | Freq: Once | ORAL | Status: AC
  Administered 2023-11-14: 1000 mg via ORAL
  Filled 2023-11-14: qty 2

## 2023-11-14 MED ORDER — CHLORHEXIDINE GLUCONATE 0.12 % MT SOLN
15.0000 mL | Freq: Once | OROMUCOSAL | Status: AC
  Administered 2023-11-14: 15 mL via OROMUCOSAL

## 2023-11-14 MED ORDER — DROPERIDOL 2.5 MG/ML IJ SOLN: 0.6250 mg | Freq: Once | INTRAMUSCULAR | Status: DC | PRN

## 2023-11-14 MED ORDER — DEXAMETHASONE SODIUM PHOSPHATE 10 MG/ML IJ SOLN
INTRAMUSCULAR | Status: DC | PRN
Start: 2023-11-14 — End: 2023-11-14
  Administered 2023-11-14: 4 mg via INTRAVENOUS

## 2023-11-14 SURGICAL SUPPLY — 19 items
BAG URO CATCHER STRL LF (MISCELLANEOUS) ×1 IMPLANT
BASKET LASER NITINOL 1.9FR (BASKET) IMPLANT
BASKET ZERO TIP NITINOL 2.4FR (BASKET) IMPLANT
CATH URETERAL DUAL LUMEN 10F (MISCELLANEOUS) IMPLANT
CATH URETL OPEN END 6FR 70 (CATHETERS) ×1 IMPLANT
CLOTH BEACON ORANGE TIMEOUT ST (SAFETY) ×1 IMPLANT
GLOVE BIO SURGEON STRL SZ7.5 (GLOVE) ×1 IMPLANT
GOWN STRL REUS W/ TWL XL LVL3 (GOWN DISPOSABLE) ×1 IMPLANT
GUIDEWIRE ANG ZIPWIRE 038X150 (WIRE) IMPLANT
GUIDEWIRE STR DUAL SENSOR (WIRE) ×1 IMPLANT
KIT TURNOVER KIT A (KITS) ×1 IMPLANT
MANIFOLD NEPTUNE II (INSTRUMENTS) ×1 IMPLANT
PACK CYSTO (CUSTOM PROCEDURE TRAY) ×1 IMPLANT
SHEATH NAVIGATOR HD 11/13X28 (SHEATH) IMPLANT
SHEATH NAVIGATOR HD 11/13X36 (SHEATH) IMPLANT
STENT URET 6FRX24 CONTOUR (STENTS) IMPLANT
TRACTIP FLEXIVA PULS ID 200XHI (Laser) IMPLANT
TUBING CONNECTING 10 (TUBING) ×1 IMPLANT
TUBING UROLOGY SET (TUBING) ×1 IMPLANT

## 2023-11-14 NOTE — Transfer of Care (Signed)
 Immediate Anesthesia Transfer of Care Note  Patient: Nakhia Levitan  Procedure(s) Performed: CYSTOSCOPY/URETEROSCOPY/HOLMIUM LASER/STENT PLACEMENT (Right)  Patient Location: PACU  Anesthesia Type:General  Level of Consciousness: awake and alert   Airway & Oxygen  Therapy: Patient Spontanous Breathing and Patient connected to face mask oxygen   Post-op Assessment: Report given to RN and Post -op Vital signs reviewed and stable  Post vital signs: Reviewed and stable  Last Vitals:  Vitals Value Taken Time  BP 143/54 11/14/23 12:57  Temp    Pulse    Resp 15 11/14/23 13:00  SpO2    Vitals shown include unfiled device data.  Last Pain:  Vitals:   11/14/23 1001  TempSrc:   PainSc: 0-No pain         Complications: No notable events documented.

## 2023-11-14 NOTE — Discharge Instructions (Signed)

## 2023-11-14 NOTE — Op Note (Signed)
 Operative Note  Preoperative diagnosis:  1.  Right ureteral calculus  Postoperative diagnosis: 1.  Right ureteral calculus  Procedure(s): 1.  Cystoscopy with right retrograde pyelogram, right ureteroscopy with laser lithotripsy, ureteral stent placement  Surgeon: Sherwood Edison, MD  Assistants: None  Anesthesia: General  Complications: None immediate  EBL: Minimal  Specimens: 1.  None  Drains/Catheters: 1.  6 x 24 double-J ureteral stent  Intraoperative findings: 1.  Normal urethra and bladder 2.  Right distal ureteral calculus 3.  Right retrograde pyelogram after treatment of the stone showed no filling defect in mild to moderate hydronephrosis  Indication: 78 year old female with a right ureteral calculus presents for previously mentioned operation.  Description of procedure:  The patient was identified and consent was obtained.  The patient was taken to the operating room and placed in the supine position.  The patient was placed under general anesthesia.  Perioperative antibiotics were administered.  The patient was placed in dorsal lithotomy.  Patient was prepped and draped in a standard sterile fashion and a timeout was performed.  A 21 French rigid cystoscope was Gaile into the urethra and into the bladder.  Complete cystoscopy was performed with findings noted above.  The right ureter was cannulated with a sensor wire which was advanced up to the kidney under fluoroscopic guidance.  Semirigid ureteroscopy was performed alongside the wire up to the stone which was laser fragmented all dust settings to tiny fragments.  I advanced scope up to the renal pelvis and no other stones were seen.  I shot a retrograde pyelogram through the scope with the findings noted above.  I withdrew the scope visualizing the ureter upon removal.  There were no clinically significant stones remaining.  There was some ureteral edema in the distal ureter at the level of stone impaction but no ureteral  injury was identified.  I backloaded the wire onto a rigid cystoscope and advanced that into the bladder followed by routine placement of a 6 x 24 double-J ureteral stent.  Fluoroscopy confirmed proximal placement and direct visualization confirmed a good coil within the bladder.  I drained the bladder and withdrew the scope.  Patient tolerated the procedure well and was stable postoperatively.  Plan: Follow-up in 1 week for stent removal.  Okay to come out a little bit sooner if she is having difficulties with it.

## 2023-11-14 NOTE — H&P (Addendum)
 H&P  Chief Complaint: Right ureteral calculus  History of Present Illness: 78 year old female with an 8 mm right ureteral calculus presents for ureteroscopy.  Cardiology risk stratification obtained.  Past Medical History:  Diagnosis Date   Abnormal INR 11/30/2015   Allergic rhinosinusitis    Anticoagulated on Coumadin  03/24/2019   Arteriosclerosis of coronary artery 03/16/2015   Asymptomatic bilateral carotid artery stenosis 09/30/2015   Atherosclerosis 01/14/2018   Bilateral carotid bruits 01/18/2018   Bronchial asthma    Chronic diarrhea    Cigarette smoker 11/01/2021   Coronary artery disease involving native coronary artery of native heart without angina pectoris 12/04/2019   Depression 01/14/2018   Disease of thyroid  gland 01/14/2018   hypothyroidism   Encounter for therapeutic drug level monitoring 05/06/2015   Essential hypertension 05/20/2019   Ex-smoker 01/18/2018   GI bleed 03/16/2015   History of kidney stones    History of right-sided carotid endarterectomy    Hyperlipidemia    Hypothyroidism    IBS (irritable bowel syndrome)    Incisional hernia 07/19/2016   Long term current use of anticoagulant therapy 05/06/2015   Mild memory disturbance    Mixed hyperlipidemia 05/20/2019   Osteoporosis    Overweight    Peripheral vascular disease (HCC) 05/06/2015   Skin cancer    Vitamin D  deficiency    Past Surgical History:  Procedure Laterality Date   ABDOMINAL AORTOGRAM W/LOWER EXTREMITY Bilateral 12/19/2021   Procedure: ABDOMINAL AORTOGRAM W/LOWER EXTREMITY;  Surgeon: Court Dorn PARAS, MD;  Location[facility];  Service: Cardiovascular;  Laterality: Bilateral;   CAROTID STENT     CESAREAN SECTION     COLONOSCOPY     CORONARY ANGIOPLASTY WITH STENT PLACEMENT  2015   2 Stents   HERNIA REPAIR  03/31/2019   Dr. Lynwood Search III MD at [facility]    Home Medications:  Medications Prior to Admission  Medication Sig Dispense Refill Last Dose/Taking    acetaminophen  (TYLENOL ) 500 MG tablet Take 500-1,000 mg by mouth every 6 (six) hours as needed for mild pain (pain score 1-3) or moderate pain (pain score 4-6).   Past Week   aspirin  EC 81 MG tablet Take 81 mg by mouth daily.   11/09/2023   atorvastatin (LIPITOR) 80 MG tablet Take 80 mg by mouth daily.   11/13/2023   buPROPion (WELLBUTRIN XL) 150 MG 24 hr tablet Take 150 mg by mouth daily.   11/14/2023 at  6:00 AM   calcium carbonate (OS-CAL) 600 MG tablet Take 600 mg by mouth daily.   11/13/2023   cilostazol  (PLETAL ) 100 MG tablet Take 1 tablet (100 mg total) by mouth 2 (two) times daily. 180 tablet 1 11/13/2023   donepezil (ARICEPT) 10 MG tablet Take 10 mg by mouth daily.   11/14/2023 at  6:00 AM   fexofenadine (ALLEGRA) 60 MG tablet Take 60 mg by mouth daily.   Taking   fluticasone (FLONASE) 50 MCG/ACT nasal spray Place 2 sprays into both nostrils daily as needed for allergies.   Past Month   levothyroxine (SYNTHROID, LEVOTHROID) 25 MCG tablet Take 25 mcg by mouth daily before breakfast.   11/14/2023 at  6:00 AM   lisinopril (PRINIVIL,ZESTRIL) 5 MG tablet Take 5 mg by mouth daily.   11/13/2023   montelukast (SINGULAIR) 10 MG tablet Take 10 mg by mouth at bedtime.   11/13/2023   Multiple Vitamins-Minerals (MULTIVITAMIN WITH MINERALS) tablet Take 1 tablet by mouth daily.   Past Month   nitroGLYCERIN  (NITROSTAT ) 0.4 MG  SL tablet Place 0.4 mg under the tongue every 5 (five) minutes as needed for chest pain.   Taking As Needed   sertraline (ZOLOFT) 50 MG tablet Take 50 mg by mouth daily.   11/14/2023 at  6:00 AM   Vitamin D , Ergocalciferol , (DRISDOL) 50000 units CAPS capsule Take 50,000 Units by mouth every Saturday.   11/10/2023   warfarin (COUMADIN ) 5 MG tablet Take 1-1.5 tablets (5-7.5 mg total) by mouth See admin instructions. 5 mg daily Except takes 7.5mg  on Tuesday and Thursday (Patient taking differently: Take 5-7.5 mg by mouth See admin instructions. 5 mg daily, Except takes 7.5mg  on Saturday ,Sunday  Tuesday and Thursday) 135 tablet 1 11/09/2023   albuterol (VENTOLIN HFA) 108 (90 Base) MCG/ACT inhaler Inhale 2 puffs into the lungs as needed for wheezing or shortness of breath.   More than a month   alendronate (FOSAMAX) 70 MG tablet Take 70 mg by mouth every Saturday.   More than a month   diclofenac Sodium (VOLTAREN) 1 % GEL Apply 1 Application topically daily as needed (pain).   More than a month   Allergies:  Allergies  Allergen Reactions   Brilinta [Ticagrelor] Hives   Codeine     Pt can probably take med, unsure of reaction    Iodinated Contrast Media Hives    Broke out in Hives in abdominal area     Family History  Problem Relation Age of Onset   Hypertension Mother    Breast cancer Mother    Kidney cancer Father    Diabetes Sister    Heart Problems Sister    Diabetes Brother    Heart Problems Brother    Social History:  reports that she has been smoking cigarettes. She has never used smokeless tobacco. She reports that she does not currently use drugs after having used the following drugs: PCP. No history on file for alcohol use.  ROS: A complete review of systems was performed.  All systems are negative except for pertinent findings as noted. ROS   Physical Exam:  Vital signs in last 24 hours: Temp:  [97.8 F (36.6 C)] 97.8 F (36.6 C) (06/25 0923) Pulse Rate:  [70] 70 (06/25 0923) Resp:  [16] 16 (06/25 0923) BP: (109)/(64) 109/64 (06/25 0923) SpO2:  [96 %] 96 % (06/25 0923) Weight:  [59 kg] 59 kg (06/25 1001) General:  Alert and oriented, No acute distress HEENT: Normocephalic, atraumatic Neck: No JVD or lymphadenopathy Cardiovascular: Regular rate and rhythm Lungs: Regular rate and effort Abdomen: Soft, nontender, nondistended, no abdominal masses Back: No CVA tenderness Extremities: No edema Neurologic: Grossly intact  Laboratory Data:  No results found for this or any previous visit (from the past 24 hours). No results found for this or any  previous visit (from the past 240 hours). Creatinine: No results for input(s): CREATININE in the last 168 hours.  Impression/Assessment:  Right ureteral calculus  Plan:  Proceed with right ureteroscopy with laser lithotripsy, ureteral stent placement.  Risk benefits discussed.  Consent obtained.  Lisa Gilbert, III 11/14/2023, 10:26 AM

## 2023-11-14 NOTE — Anesthesia Postprocedure Evaluation (Signed)
 Anesthesia Post Note  Patient: Lisa Gilbert  Procedure(s) Performed: CYSTOSCOPY/URETEROSCOPY/HOLMIUM LASER/STENT PLACEMENT (Right)     Patient location during evaluation: PACU Anesthesia Type: General Level of consciousness: sedated and patient cooperative Pain management: pain level controlled Vital Signs Assessment: post-procedure vital signs reviewed and stable Respiratory status: spontaneous breathing Cardiovascular status: stable Anesthetic complications: no   No notable events documented.  Last Vitals:  Vitals:   11/14/23 1345 11/14/23 1355  BP: 122/63 120/63  Pulse: 80 76  Resp: (!) 22   Temp: (!) 36.4 C   SpO2: 93% 96%    Last Pain:  Vitals:   11/14/23 1355  TempSrc:   PainSc: 0-No pain                 Norleen Pope

## 2023-11-14 NOTE — Anesthesia Procedure Notes (Signed)
 Procedure Name: LMA Insertion Date/Time: 11/14/2023 12:10 PM  Performed by: Deeann Eva BROCKS, CRNAPre-anesthesia Checklist: Patient identified, Emergency Drugs available, Suction available and Patient being monitored Patient Re-evaluated:Patient Re-evaluated prior to induction Oxygen  Delivery Method: Circle System Utilized Preoxygenation: Pre-oxygenation with 100% oxygen  Induction Type: IV induction Ventilation: Mask ventilation without difficulty LMA: LMA inserted LMA Size: 4.0 Number of attempts: 1 Airway Equipment and Method: Bite block Placement Confirmation: positive ETCO2 Tube secured with: Tape Dental Injury: Teeth and Oropharynx as per pre-operative assessment

## 2023-11-14 NOTE — Anesthesia Preprocedure Evaluation (Addendum)
 Anesthesia Evaluation  Patient identified by MRN, date of birth, ID band Patient awake    Reviewed: Allergy & Precautions, NPO status , Patient's Chart, lab work & pertinent test results  Airway Mallampati: II  TM Distance: >3 FB Neck ROM: Full    Dental  (+) Dental Advisory Given, Teeth Intact   Pulmonary asthma , Current Smoker   Pulmonary exam normal breath sounds clear to auscultation       Cardiovascular Exercise Tolerance: Good hypertension, Pt. on medications + CAD, + Cardiac Stents and + Peripheral Vascular Disease   Rhythm:Regular Rate:Normal + Systolic murmurs Echo 10/2021  1. Left ventricular ejection fraction, by estimation, is 60 to 65%. The left ventricle has normal function. The left ventricle has no regional wall motion abnormalities. There is mild concentric left ventricular hypertrophy. Left ventricular diastolic parameters are consistent with Grade I diastolic dysfunction (impaired relaxation).   2. Right ventricular systolic function is normal. The right ventricular size is normal. There is normal pulmonary artery systolic pressure.   3. The mitral valve is normal in structure. No evidence of mitral valve regurgitation. No evidence of mitral stenosis.   4. The aortic valve is tricuspid. There is mild calcification of the aortic valve. Aortic valve regurgitation is not visualized. Mild aortic valve stenosis.   5. Aortic Normal DTA.   6. The inferior vena cava is normal in size with greater than 50% respiratory variability, suggesting right atrial pressure of 3 mmHg.    Stress 01/2018  Nuclear stress EF: 90%.  The left ventricular ejection fraction is hyperdynamic (>65%).  Blood pressure demonstrated a normal response to exercise.  There was no ST segment deviation noted during stress.  The study is normal.  This is a low risk study.    Neuro/Psych  PSYCHIATRIC DISORDERS  Depression    negative  neurological ROS     GI/Hepatic negative GI ROS, Neg liver ROS,,,  Endo/Other  Hypothyroidism    Renal/GU negative Renal ROS     Musculoskeletal negative musculoskeletal ROS (+)    Abdominal   Peds  Hematology negative hematology ROS (+)   Anesthesia Other Findings   Reproductive/Obstetrics                             Anesthesia Physical Anesthesia Plan  ASA: 3  Anesthesia Plan: General   Post-op Pain Management: Tylenol  PO (pre-op)*   Induction: Intravenous  PONV Risk Score and Plan: 3 and Ondansetron , Dexamethasone  and Treatment may vary due to age or medical condition  Airway Management Planned: LMA  Additional Equipment:   Intra-op Plan:   Post-operative Plan: Extubation in OR  Informed Consent: I have reviewed the patients History and Physical, chart, labs and discussed the procedure including the risks, benefits and alternatives for the proposed anesthesia with the patient or authorized representative who has indicated his/her understanding and acceptance.     Dental advisory given  Plan Discussed with: CRNA  Anesthesia Plan Comments:         Anesthesia Quick Evaluation

## 2023-11-15 ENCOUNTER — Encounter (HOSPITAL_COMMUNITY): Payer: Self-pay | Admitting: Urology

## 2023-11-21 DIAGNOSIS — N201 Calculus of ureter: Secondary | ICD-10-CM | POA: Diagnosis not present

## 2023-11-27 DIAGNOSIS — R791 Abnormal coagulation profile: Secondary | ICD-10-CM | POA: Diagnosis not present

## 2023-12-04 ENCOUNTER — Ambulatory Visit: Admitting: Cardiology

## 2023-12-04 DIAGNOSIS — R791 Abnormal coagulation profile: Secondary | ICD-10-CM | POA: Diagnosis not present

## 2023-12-11 DIAGNOSIS — R791 Abnormal coagulation profile: Secondary | ICD-10-CM | POA: Diagnosis not present

## 2023-12-18 DIAGNOSIS — R791 Abnormal coagulation profile: Secondary | ICD-10-CM | POA: Diagnosis not present

## 2023-12-25 DIAGNOSIS — R791 Abnormal coagulation profile: Secondary | ICD-10-CM | POA: Diagnosis not present

## 2023-12-28 DIAGNOSIS — N201 Calculus of ureter: Secondary | ICD-10-CM | POA: Diagnosis not present

## 2023-12-28 DIAGNOSIS — N132 Hydronephrosis with renal and ureteral calculous obstruction: Secondary | ICD-10-CM | POA: Diagnosis not present

## 2024-01-08 DIAGNOSIS — L821 Other seborrheic keratosis: Secondary | ICD-10-CM | POA: Diagnosis not present

## 2024-01-08 DIAGNOSIS — L578 Other skin changes due to chronic exposure to nonionizing radiation: Secondary | ICD-10-CM | POA: Diagnosis not present

## 2024-01-08 DIAGNOSIS — C44622 Squamous cell carcinoma of skin of right upper limb, including shoulder: Secondary | ICD-10-CM | POA: Diagnosis not present

## 2024-01-08 DIAGNOSIS — C44629 Squamous cell carcinoma of skin of left upper limb, including shoulder: Secondary | ICD-10-CM | POA: Diagnosis not present

## 2024-01-08 DIAGNOSIS — C44722 Squamous cell carcinoma of skin of right lower limb, including hip: Secondary | ICD-10-CM | POA: Diagnosis not present

## 2024-01-22 DIAGNOSIS — E663 Overweight: Secondary | ICD-10-CM | POA: Diagnosis not present

## 2024-01-22 DIAGNOSIS — Z7901 Long term (current) use of anticoagulants: Secondary | ICD-10-CM | POA: Diagnosis not present

## 2024-01-29 DIAGNOSIS — Z7901 Long term (current) use of anticoagulants: Secondary | ICD-10-CM | POA: Diagnosis not present

## 2024-01-29 DIAGNOSIS — E663 Overweight: Secondary | ICD-10-CM | POA: Diagnosis not present

## 2024-02-12 DIAGNOSIS — Z7901 Long term (current) use of anticoagulants: Secondary | ICD-10-CM | POA: Diagnosis not present

## 2024-02-12 DIAGNOSIS — Z0000 Encounter for general adult medical examination without abnormal findings: Secondary | ICD-10-CM | POA: Diagnosis not present

## 2024-02-12 DIAGNOSIS — Z9181 History of falling: Secondary | ICD-10-CM | POA: Diagnosis not present

## 2024-02-12 DIAGNOSIS — Z1331 Encounter for screening for depression: Secondary | ICD-10-CM | POA: Diagnosis not present

## 2024-02-12 DIAGNOSIS — Z87891 Personal history of nicotine dependence: Secondary | ICD-10-CM | POA: Diagnosis not present

## 2024-02-12 DIAGNOSIS — Z1231 Encounter for screening mammogram for malignant neoplasm of breast: Secondary | ICD-10-CM | POA: Diagnosis not present

## 2024-02-12 DIAGNOSIS — N959 Unspecified menopausal and perimenopausal disorder: Secondary | ICD-10-CM | POA: Diagnosis not present

## 2024-03-11 DIAGNOSIS — Z7901 Long term (current) use of anticoagulants: Secondary | ICD-10-CM | POA: Diagnosis not present

## 2024-03-18 DIAGNOSIS — C44629 Squamous cell carcinoma of skin of left upper limb, including shoulder: Secondary | ICD-10-CM | POA: Diagnosis not present

## 2024-03-18 DIAGNOSIS — Z7901 Long term (current) use of anticoagulants: Secondary | ICD-10-CM | POA: Diagnosis not present

## 2024-03-18 DIAGNOSIS — C44622 Squamous cell carcinoma of skin of right upper limb, including shoulder: Secondary | ICD-10-CM | POA: Diagnosis not present

## 2024-03-27 DIAGNOSIS — C44622 Squamous cell carcinoma of skin of right upper limb, including shoulder: Secondary | ICD-10-CM | POA: Diagnosis not present

## 2024-03-27 DIAGNOSIS — C44629 Squamous cell carcinoma of skin of left upper limb, including shoulder: Secondary | ICD-10-CM | POA: Diagnosis not present

## 2024-04-01 DIAGNOSIS — Z7901 Long term (current) use of anticoagulants: Secondary | ICD-10-CM | POA: Diagnosis not present

## 2024-04-09 DIAGNOSIS — Z7901 Long term (current) use of anticoagulants: Secondary | ICD-10-CM | POA: Diagnosis not present

## 2024-04-10 ENCOUNTER — Ambulatory Visit: Payer: Self-pay | Admitting: Cardiovascular Disease

## 2024-04-10 ENCOUNTER — Ambulatory Visit (HOSPITAL_COMMUNITY)
Admission: RE | Admit: 2024-04-10 | Discharge: 2024-04-10 | Disposition: A | Discharge status: Home / Self Care | Source: Ambulatory Visit | Attending: Cardiovascular Disease | Admitting: Cardiovascular Disease

## 2024-04-10 DIAGNOSIS — I779 Disorder of arteries and arterioles, unspecified: Secondary | ICD-10-CM

## 2024-04-10 DIAGNOSIS — I6523 Occlusion and stenosis of bilateral carotid arteries: Secondary | ICD-10-CM | POA: Insufficient documentation

## 2024-04-15 DIAGNOSIS — Z7901 Long term (current) use of anticoagulants: Secondary | ICD-10-CM | POA: Diagnosis not present

## 2024-04-22 DIAGNOSIS — Z7901 Long term (current) use of anticoagulants: Secondary | ICD-10-CM | POA: Diagnosis not present

## 2024-04-29 ENCOUNTER — Other Ambulatory Visit: Payer: Self-pay | Admitting: Internal Medicine

## 2024-04-29 DIAGNOSIS — Z1231 Encounter for screening mammogram for malignant neoplasm of breast: Secondary | ICD-10-CM

## 2024-04-30 ENCOUNTER — Inpatient Hospital Stay: Admission: RE | Admit: 2024-04-30 | Discharge: 2024-04-30 | Attending: Internal Medicine | Admitting: Internal Medicine

## 2024-04-30 DIAGNOSIS — Z1231 Encounter for screening mammogram for malignant neoplasm of breast: Secondary | ICD-10-CM
